# Patient Record
Sex: Female | Born: 1935 | Race: White | Hispanic: No | Smoking: Former smoker
Health system: Southern US, Community
[De-identification: ages and names within clinical notes are randomized; demographics above are authoritative.]

## PROBLEM LIST (undated history)

## (undated) DIAGNOSIS — H353190 Nonexudative age-related macular degeneration, unspecified eye, stage unspecified: Secondary | ICD-10-CM

## (undated) DIAGNOSIS — F0390 Unspecified dementia without behavioral disturbance: Secondary | ICD-10-CM

## (undated) DIAGNOSIS — F32A Depression, unspecified: Secondary | ICD-10-CM

## (undated) DIAGNOSIS — R7303 Prediabetes: Secondary | ICD-10-CM

## (undated) DIAGNOSIS — U071 COVID-19: Secondary | ICD-10-CM

## (undated) DIAGNOSIS — C4491 Basal cell carcinoma of skin, unspecified: Secondary | ICD-10-CM

## (undated) DIAGNOSIS — E785 Hyperlipidemia, unspecified: Secondary | ICD-10-CM

## (undated) DIAGNOSIS — J189 Pneumonia, unspecified organism: Secondary | ICD-10-CM

## (undated) DIAGNOSIS — M1990 Unspecified osteoarthritis, unspecified site: Secondary | ICD-10-CM

## (undated) DIAGNOSIS — R32 Unspecified urinary incontinence: Secondary | ICD-10-CM

## (undated) DIAGNOSIS — F329 Major depressive disorder, single episode, unspecified: Secondary | ICD-10-CM

## (undated) DIAGNOSIS — N1831 Chronic kidney disease, stage 3a: Secondary | ICD-10-CM

## (undated) DIAGNOSIS — K219 Gastro-esophageal reflux disease without esophagitis: Secondary | ICD-10-CM

## (undated) DIAGNOSIS — L03116 Cellulitis of left lower limb: Secondary | ICD-10-CM

## (undated) DIAGNOSIS — F419 Anxiety disorder, unspecified: Secondary | ICD-10-CM

## (undated) HISTORY — PX: ABDOMINAL HYSTERECTOMY: SHX81

## (undated) HISTORY — PX: APPENDECTOMY: SHX54

## (undated) HISTORY — PX: COLON SURGERY: SHX602

## (undated) HISTORY — PX: OTHER SURGICAL HISTORY: SHX169

## (undated) HISTORY — PX: 23 GAUGE PARS PLANA VITRECTOMY WITH 23 GAUGE MVR PORT: SHX6493

## (undated) HISTORY — PX: COLONOSCOPY: SHX174

## (undated) HISTORY — PX: TONSILLECTOMY: SUR1361

## (undated) HISTORY — PX: CHOLECYSTECTOMY: SHX55

## (undated) HISTORY — PX: JOINT REPLACEMENT: SHX530

---

## 2004-09-19 ENCOUNTER — Ambulatory Visit: Payer: Self-pay | Admitting: Anesthesiology

## 2004-10-22 ENCOUNTER — Ambulatory Visit: Payer: Self-pay | Admitting: Anesthesiology

## 2004-12-20 ENCOUNTER — Ambulatory Visit: Payer: Self-pay | Admitting: Anesthesiology

## 2005-01-21 ENCOUNTER — Ambulatory Visit: Payer: Self-pay | Admitting: Orthopaedic Surgery

## 2005-02-04 ENCOUNTER — Ambulatory Visit: Payer: Self-pay | Admitting: Orthopaedic Surgery

## 2005-02-07 ENCOUNTER — Ambulatory Visit: Payer: Self-pay | Admitting: Orthopaedic Surgery

## 2005-07-03 ENCOUNTER — Ambulatory Visit: Payer: Self-pay | Admitting: Unknown Physician Specialty

## 2008-04-09 ENCOUNTER — Ambulatory Visit: Payer: Self-pay | Admitting: Rheumatology

## 2008-11-25 HISTORY — PX: JOINT REPLACEMENT: SHX530

## 2009-07-13 ENCOUNTER — Ambulatory Visit: Payer: Self-pay | Admitting: Gastroenterology

## 2009-08-10 ENCOUNTER — Ambulatory Visit: Payer: Self-pay | Admitting: Surgery

## 2009-08-16 ENCOUNTER — Inpatient Hospital Stay: Payer: Self-pay | Admitting: Surgery

## 2010-11-27 ENCOUNTER — Emergency Department: Payer: Self-pay | Admitting: Internal Medicine

## 2011-05-15 ENCOUNTER — Ambulatory Visit: Payer: Self-pay | Admitting: Unknown Physician Specialty

## 2011-06-06 ENCOUNTER — Ambulatory Visit: Payer: Self-pay | Admitting: Unknown Physician Specialty

## 2011-06-12 ENCOUNTER — Ambulatory Visit: Payer: Self-pay | Admitting: Unknown Physician Specialty

## 2011-06-25 ENCOUNTER — Emergency Department: Payer: Self-pay

## 2011-07-09 ENCOUNTER — Ambulatory Visit: Payer: Self-pay | Admitting: Unknown Physician Specialty

## 2012-02-08 ENCOUNTER — Emergency Department: Payer: Self-pay | Admitting: Emergency Medicine

## 2012-08-31 ENCOUNTER — Ambulatory Visit: Payer: Self-pay | Admitting: General Practice

## 2012-08-31 DIAGNOSIS — M79609 Pain in unspecified limb: Secondary | ICD-10-CM

## 2012-08-31 LAB — URINALYSIS, COMPLETE
Ketone: NEGATIVE
Leukocyte Esterase: NEGATIVE
Nitrite: NEGATIVE
Ph: 5 (ref 4.5–8.0)
Protein: NEGATIVE
RBC,UR: 18 /HPF (ref 0–5)
WBC UR: 1 /HPF (ref 0–5)

## 2012-08-31 LAB — BASIC METABOLIC PANEL
Anion Gap: 8 (ref 7–16)
BUN: 16 mg/dL (ref 7–18)
Calcium, Total: 8.7 mg/dL (ref 8.5–10.1)
Chloride: 110 mmol/L — ABNORMAL HIGH (ref 98–107)
Co2: 26 mmol/L (ref 21–32)
EGFR (African American): >60
Osmolality: 287 (ref 275–301)
Potassium: 4.2 mmol/L (ref 3.5–5.1)

## 2012-08-31 LAB — CBC
MCH: 32.8 pg (ref 26.0–34.0)
MCV: 95 fL (ref 80–100)
Platelet: 272 10*3/uL (ref 150–440)
RDW: 12.8 % (ref 11.5–14.5)

## 2012-08-31 LAB — APTT: Activated PTT: 30.6 secs (ref 23.6–35.9)

## 2012-08-31 LAB — SEDIMENTATION RATE: Erythrocyte Sed Rate: 17 mm/hr (ref 0–30)

## 2012-08-31 LAB — PROTIME-INR
INR: 1
Prothrombin Time: 13.9 secs (ref 11.5–14.7)

## 2012-08-31 LAB — MRSA PCR SCREENING

## 2012-09-01 LAB — URINE CULTURE

## 2012-09-14 ENCOUNTER — Inpatient Hospital Stay: Payer: Self-pay | Admitting: General Practice

## 2012-09-15 LAB — BASIC METABOLIC PANEL WITH GFR
Anion Gap: 7
BUN: 11 mg/dL
Calcium, Total: 8 mg/dL — ABNORMAL LOW
Chloride: 106 mmol/L
Co2: 28 mmol/L
Creatinine: 0.78 mg/dL
EGFR (African American): >60
EGFR (Non-African Amer.): >60
Glucose: 100 mg/dL — ABNORMAL HIGH
Osmolality: 281
Potassium: 4 mmol/L
Sodium: 141 mmol/L

## 2012-09-15 LAB — HEMOGLOBIN: HGB: 10.4 g/dL — ABNORMAL LOW (ref 12.0–16.0)

## 2012-09-15 LAB — PLATELET COUNT: Platelet: 220 10*3/uL

## 2012-09-16 LAB — BASIC METABOLIC PANEL
Anion Gap: 8 (ref 7–16)
BUN: 11 mg/dL (ref 7–18)
Creatinine: 0.72 mg/dL (ref 0.60–1.30)
EGFR (Non-African Amer.): >60
Glucose: 96 mg/dL (ref 65–99)
Osmolality: 282 (ref 275–301)
Potassium: 3.8 mmol/L (ref 3.5–5.1)

## 2012-09-16 LAB — PLATELET COUNT: Platelet: 232 10*3/uL (ref 150–440)

## 2013-01-25 ENCOUNTER — Ambulatory Visit: Payer: Self-pay | Admitting: Ophthalmology

## 2014-05-07 DIAGNOSIS — E785 Hyperlipidemia, unspecified: Secondary | ICD-10-CM | POA: Insufficient documentation

## 2014-05-07 DIAGNOSIS — M1990 Unspecified osteoarthritis, unspecified site: Secondary | ICD-10-CM | POA: Insufficient documentation

## 2014-06-22 DIAGNOSIS — Z86010 Personal history of colonic polyps: Secondary | ICD-10-CM | POA: Insufficient documentation

## 2014-06-22 DIAGNOSIS — Z860101 Personal history of adenomatous and serrated colon polyps: Secondary | ICD-10-CM | POA: Insufficient documentation

## 2014-07-05 ENCOUNTER — Ambulatory Visit: Payer: Self-pay | Admitting: Gastroenterology

## 2014-07-07 LAB — PATHOLOGY REPORT

## 2014-09-15 DIAGNOSIS — F325 Major depressive disorder, single episode, in full remission: Secondary | ICD-10-CM | POA: Insufficient documentation

## 2015-03-14 NOTE — Op Note (Signed)
PATIENT NAME:  Melissa Anthony, Melissa Anthony MR#:  [id number] DATE OF BIRTH:  05-06-1934  DATE OF PROCEDURE:  09/14/2012  PREOPERATIVE DIAGNOSIS: Degenerative arthrosis of the right knee.   POSTOPERATIVE DIAGNOSIS: Degenerative arthrosis of the right knee.   PROCEDURE PERFORMED: Right total knee arthroplasty using computer-assisted navigation.   SURGEON: Illene LabradorJames P. Hooten, M.D.   ASSISTANT: Van ClinesJon Wolfe, PA-C (required to maintain retraction throughout the procedure)   ANESTHESIA: Femoral nerve block and spinal.   ESTIMATED BLOOD LOSS: 50 mL.   FLUIDS REPLACED: 1200 mL of crystalloid.   TOURNIQUET TIME: 92 minutes.   DRAINS: Two medium drains to reinfusion system.   SOFT TISSUE RELEASES: Anterior cruciate ligament, posterior cruciate ligament, deep medial collateral ligament, and patellofemoral ligament.   IMPLANTS UTILIZED: DePuy PFC Sigma size 4N (narrow) posterior stabilized femoral component (cemented), size 3 MBT tibial component (cemented), 35 mm three peg oval dome patella (cemented), and a 10 mm stabilized rotating platform polyethylene insert.   INDICATIONS FOR SURGERY: The patient is a 81 year old female who has been seen for complaints of progressive right knee pain. X-rays demonstrated significant degenerative changes in tricompartmental fashion. After discussion of the risks and benefits of surgical intervention, the patient expressed understanding of the risks and benefits and agreed with plans for surgical intervention.   PROCEDURE IN DETAIL: The patient was brought to the Operating Room and, after adequate femoral nerve block and spinal anesthesia was achieved, a tourniquet was placed on the patient's upper right thigh. The patient's right knee and leg were cleaned and prepped with alcohol and DuraPrep and draped in the usual sterile fashion. A "time out" was performed as per usual protocol. The right lower extremity was exsanguinated using an Esmarch, and the tourniquet was inflated to 300  mmHg. An anterior longitudinal incision was made followed by a standard mid vastus approach. The deep fibers of the medial collateral ligament were elevated in a subperiosteal fashion off the medial flare of the tibia so as to maintain a continuous soft tissue sleeve. The patella was subluxed laterally and the patellofemoral ligament was incised. Inspection of the knee demonstrated severe degenerative changes with evidence of eburnated bone to the lateral compartment. Prominent osteophytes were debrided using a rongeur. Anterior and posterior cruciate ligaments were excised. Two 4.0 mm Schanz pins were inserted into the femur and into the tibia for attachment of the array of trackers used for computer-assisted navigation. Hip center was identified using a circumduction technique. Distal landmarks were mapped using the computer. The distal femur and proximal tibia were mapped using the computer. Distal femoral cutting guide was positioned using computer-assisted navigation so as to achieve a 5 degree distal valgus cut. Cut was performed and verified using the computer. Distal femur was sized and it was felt that a size 4N (narrow) was the appropriate size. The size 4 cutting guide was positioned using computer-assisted navigation and the anterior cut was performed and verified using the computer. This was followed by completion of the posterior and chamfer cuts. Femoral cutting guide for the central box was positioned and the central box cut was performed.   Attention was then directed to the proximal tibia. Medial and lateral menisci were excised. The extramedullary tibial cutting guide was positioned using computer-assisted navigation so as to achieve 0 degree varus valgus alignment and 0 degree posterior slope. Cut was performed and verified using the computer. The proximal tibia was sized and it was felt that a size 3 tibial tray was appropriate. Tibial and femoral trials were  inserted followed by insertion of  a 10 mm polyethylene insert. The knee was felt to be tight both in flexion and in extension and there was failure to achieve full extension. The trials were removed and the proximal tibia was cut so as to resect an additional 2 mm of bone. Cut was verified using the computer. Trial components were reinserted and 10 mm polyethylene trial was placed. Excellent mediolateral soft tissue balancing was appreciated both in full extension and in flexion. Finally, the patella was cut and prepared so as to accommodate a 35 mm three peg oval dome patella. Patellar trial was placed and the knee was placed through a range of motion with excellent patellar tracking appreciated.   Femoral trial was removed. Central post hole for the tibial component was reamed followed by insertion of a keel punch. Tibial tray was then removed. The cut surfaces of bone were irrigated with copious amounts of normal saline with antibiotic solution using pulsatile lavage and then suctioned dry. Polymethyl methacrylate cement with gentamicin was prepared in the usual fashion using a vacuum mixer. Cement was applied to the cut surface of the proximal tibia as well as along the undersurface of a size 3 MBT tibial component. The tibial component was positioned and impacted into place. Excess cement was removed using freer elevators. Cement was applied to the cut surface of the femur as well as along the posterior flanges of a size 4N (narrow) posterior stabilized femoral component. Femoral component was positioned and impacted into place. Excess cement was removed using freer elevators. Finally, cement was applied to the backside of a 35 mm three peg oval dome patella and the patellar component was positioned and patellar clamp applied. Excess cement was removed using freer elevators.   After adequate curing of cement, the tourniquet was deflated after a total tourniquet time of 92 minutes. Hemostasis was achieved using electrocautery. The knee was  irrigated with copious amounts of normal saline with antibiotic solution using pulsatile lavage and then suctioned dry. The knee was inspected for any residual cement debris. 30 mL of 0.25% Marcaine with epinephrine was injected along the posterior capsule. A 10 mm stabilized rotating platform polyethylene insert was inserted and the knee was placed through a range of motion with excellent patellar tracking appreciated and excellent mediolateral soft tissue balancing noted. Two medium drains were placed in the wound bed and brought out through a separate stab incision to be attached to a reinfusion system. The medial parapatellar portion of the incision was reapproximated using interrupted sutures of #1 Vicryl. The subcutaneous tissue was approximated in layers using first #0 Vicryl followed by #2-0 Vicryl. Skin was closed with skin staples. A sterile dressing was applied.   The patient tolerated the procedure well. She was transported to the recovery room in stable condition. ____________________________ Illene Labrador. Angie Fava., MD jph:slb D: 09/14/2012 22:24:43 ET T: 09/15/2012 09:46:30 ET JOB#: [id number]  cc: Illene Labrador. Angie Fava., MD, <Dictator> JAMES P Angie Fava MD ELECTRONICALLY SIGNED 09/15/2012 21:18

## 2015-03-14 NOTE — Discharge Summary (Signed)
PATIENT NAME:  Melissa Anthony, Melissa Anthony MR#:  [id number] DATE OF BIRTH:  1934-10-25  DATE OF ADMISSION:  09/14/2012 DATE OF DISCHARGE:  09/16/2012  ADMITTING DIAGNOSIS: Degenerative arthrosis of the right knee.   DISCHARGE DIAGNOSIS: Degenerative arthrosis of the right knee.   HISTORY: The patient is a pleasant 81 year old who has been followed at [facility] for progression of right knee pain. The patient had complained of a long history of progressive right knee pain. She has previously underwent a right knee arthroscopy for a partial medial and lateral meniscectomy as well as debridement and microfracture of a chondral lesion to both the medial and lateral compartments. This was performed by Dr. Erin Sons in July 2012. She saw modest improvement following Synvisc I injections as well. She states she has continued to have increased swelling and pain to the right knee. She has not seen any significant improvement in her condition despite the use of over-the-counter Tylenol, Aleve and has been taking some Tramadol. She did not see any significant improvement following cortisone injection earlier this year. She reports pain both to the medial and lateral aspect of the knee. The pain was noted be aggravated with weight-bearing activities. She is having increasing difficulty with arising from a sitting position. Her pain had progressed to the point that it was significantly interfering with her activities of daily living. X-rays taken in [facility] showed narrowing of the lateral cartilage space with associated valgus alignment. She was noted to have subchondral sclerosis as well as osteophyte formation. After discussion of the risks and benefits of surgical intervention, the patient expressed her understanding of the risks and benefits and agreed with plans for surgical intervention.   PROCEDURE: Right total knee arthroplasty using computer-assisted navigation.   ANESTHESIA: Femoral nerve  block with spinal.   IMPLANTS UTILIZED: DePuy PFC Sigma size 4 narrow posterior stabilized femoral component (cemented), size 3 MBT tibial component (cemented), 35 mm three pegged oval dome patella (cemented), and a 10 mm stabilized rotating platform polyethylene insert.   HOSPITAL COURSE: The patient tolerated the procedure very well. She had no complications. She was then taken to the PAC-U where she was stabilized and then transferred to the orthopedic floor. The patient began receiving anticoagulation therapy of Lovenox 30 mg subcutaneous every 12 hours per anesthesia and pharmacy protocol. She was fitted with TED stockings bilaterally. These were allowed to be removed one hour per eight hour shift. She was also fitted with the AV-I compression foot pumps bilaterally set at 80 mmHg. Her calves have been nontender. She has had no evidence of any deep venous thromboses. Negative Homans sign. Heels were elevated off the bed using rolled towels.   The patient has denied any chest pain or shortness of breath. Her vital signs have been stable. She has been afebrile. Hemodynamically she was stable and no transfusions were given other than the Autovac transfusion given the first six hours postoperatively.   Physical therapy was initiated on day one for gait training and transfers. Upon being discharged she was ambulating greater than 300 feet. She was able go up and down four sets of steps. She was independent with bed to chair transfers. Occupational therapy was also initiated on day one for activities of daily living and assistive devices.   DISPOSITION: The patient is being discharged to home in improved stable condition.   DISCHARGE INSTRUCTIONS: She may weight bear as tolerated. Continue using a walker until cleared by physical therapy to go to a quad cane.  She will receive home health physical therapy. This is for the duration of two weeks. She is to continue wearing her TED stockings bilaterally.  These may be removed at night and worn during the day. She is also to continue the Polar Care maintaining a temperature of 40 to 50 degrees Fahrenheit. She is placed on a regular diet. She is to resume her regular medication that she was on prior to admission. She was given a prescription for Lovenox 40 mg, dispense 14 days, one subcutaneous daily for 14 days then discontinue and begin taking one 81 mg enteric-coated aspirin and a second prescription for Roxicodone 5 to 10 mg every 4 to 6 hours p.r.n. for pain and Ultram 50 to 100 mg every 4 to 6 hours p.r.n. for pain.   PAST MEDICAL HISTORY:  1. Chickenpox.  2. Colon polyps. 3. Arthritis. 4. Acid reflux.       5. Hyperlipidemia.  6. Cervical and lumbar osteoarthritis. ____________________________ Van ClinesJon Deniyah Dillavou, PA jrw:slb D: 09/16/2012 14:15:09 ET T: 09/16/2012 14:41:41 ET JOB#: [id number]  cc: Van ClinesJon Paije Goodhart, PA, <Dictator> Conchita Truxillo PA ELECTRONICALLY SIGNED 09/16/2012 20:52

## 2015-03-17 DIAGNOSIS — N959 Unspecified menopausal and perimenopausal disorder: Secondary | ICD-10-CM | POA: Insufficient documentation

## 2015-03-17 NOTE — Op Note (Signed)
PATIENT NAME:  Melissa Anthony, Melissa Anthony MR#:  [id number] DATE OF BIRTH:  12/03/1933  DATE OF PROCEDURE:  01/25/2013  PREOPERATIVE DIAGNOSIS: Cataract, right eye.   POSTOPERATIVE DIAGNOSIS: Cataract, right eye.   PROCEDURE PERFORMED: Extracapsular cataract extraction using phacoemulsification with placement of an Alcon [id number] 22.5 diopter posterior chamber lens with 2.25 diopter cylinder, serial #[id number].   SURGEON: Maylon PeppersSteven A. Dingeldein, M.D.   ANESTHESIA: 4% lidocaine and 0.75% Marcaine a 50-50 mixture with 10 units/mL of Hylenex added, given as a peribulbar.   ANESTHESIOLOGIST: Randall AnGjibertus Van Staveren, MD  COMPLICATIONS: None.   ESTIMATED BLOOD LOSS: Less than 1 mL.  DESCRIPTION OF PROCEDURE: The patient was brought to the operating room and both eyes were anesthetized with topical proparacaine. With the patient sitting upright, an ASICO toric marker was used to mark the 3 and 9 o'clock positions, with the patient fixating at a distant target. The patient was placed supine, given IV sedation and peribulbar block. She was then prepped and draped in the usual fashion. The vertical rectus muscles were imbricated using 5-0 silk sutures, bridle sutures. A limbal peritomy was carried out for 1 o'clock hour and centered on 90 degrees, which had been marked with a marking pen, and an incision was made with a 64 blade. This was dissected anteriorly into clear cornea with Alcon crescent knife. The anterior chamber was entered superonasally through clear cornea with a paracentesis knife and through the lamellar dissection with a 2.6 mm keratome. DisCoVisc was used to replace the aqueous and continuous tear circular capsulorrhexis was carried out without difficulty. Hydrodissection was used to loosen the nucleus and phacoemulsification was carried out in a divide and conquer technique. Total ultrasound time was 1 minute and 24.5 seconds with an average power of 25.4% and CDE of 39.69. Irrigation and aspiration  was used to remove the residual cortex. The capsular bag was inflated with DisCoVisc and the intraocular lens was inserted using a ParamedicMonarch shooter. The lens was rotated to bring the marks on the base of the haptics or the periphery of the optic nudge just slightly beyond the 180 degree mark. The intended position was 1 degree and I felt that marking using a toric marker would not add to the accuracy of the procedure. Irrigation and aspiration was used to remove the residual DisCoVisc. The wound was inflated with balanced salt. The position of the lens was checked 1 more time. Miostat was injected through the paracentesis track. A tenth of a milliliter of cefuroxime was injected into the anterior chamber above the lens. The wound was checked for leaks, none were found. The conjunctiva was closed with cautery. Bridle sutures were removed. Two drops of Vigamox were placed on the eye. A shield was placed on the eye. The patient was discharged to the recovery room in good condition.  ____________________________ Maylon PeppersSteven A. Dingeldein, MD sad:sb D: 01/25/2013 13:44:32 ET T: 01/25/2013 14:30:25 ET JOB#: [id number]  cc: Viviann SpareSteven A. Dingeldein, MD, <Dictator> Erline LevineSTEVEN A DINGELDEIN MD ELECTRONICALLY SIGNED 02/01/2013 8:23

## 2018-03-24 ENCOUNTER — Inpatient Hospital Stay
Admission: EM | Admit: 2018-03-24 | Discharge: 2018-03-25 | DRG: 194 | Disposition: A | Discharge status: Home / Self Care | Payer: MEDICARE | Attending: Internal Medicine | Admitting: Internal Medicine

## 2018-03-24 ENCOUNTER — Encounter: Payer: Self-pay | Admitting: Emergency Medicine

## 2018-03-24 ENCOUNTER — Emergency Department: Payer: MEDICARE

## 2018-03-24 ENCOUNTER — Other Ambulatory Visit: Payer: Self-pay

## 2018-03-24 DIAGNOSIS — Z791 Long term (current) use of non-steroidal anti-inflammatories (NSAID): Secondary | ICD-10-CM | POA: Diagnosis not present

## 2018-03-24 DIAGNOSIS — R4182 Altered mental status, unspecified: Secondary | ICD-10-CM

## 2018-03-24 DIAGNOSIS — Z8249 Family history of ischemic heart disease and other diseases of the circulatory system: Secondary | ICD-10-CM | POA: Diagnosis not present

## 2018-03-24 DIAGNOSIS — M1990 Unspecified osteoarthritis, unspecified site: Secondary | ICD-10-CM | POA: Diagnosis present

## 2018-03-24 DIAGNOSIS — E860 Dehydration: Secondary | ICD-10-CM | POA: Diagnosis present

## 2018-03-24 DIAGNOSIS — F430 Acute stress reaction: Secondary | ICD-10-CM | POA: Diagnosis present

## 2018-03-24 DIAGNOSIS — E876 Hypokalemia: Secondary | ICD-10-CM | POA: Diagnosis not present

## 2018-03-24 DIAGNOSIS — N179 Acute kidney failure, unspecified: Secondary | ICD-10-CM | POA: Diagnosis present

## 2018-03-24 DIAGNOSIS — Z90710 Acquired absence of both cervix and uterus: Secondary | ICD-10-CM

## 2018-03-24 DIAGNOSIS — E785 Hyperlipidemia, unspecified: Secondary | ICD-10-CM | POA: Diagnosis not present

## 2018-03-24 DIAGNOSIS — Z79890 Hormone replacement therapy (postmenopausal): Secondary | ICD-10-CM

## 2018-03-24 DIAGNOSIS — K219 Gastro-esophageal reflux disease without esophagitis: Secondary | ICD-10-CM | POA: Diagnosis present

## 2018-03-24 DIAGNOSIS — J189 Pneumonia, unspecified organism: Secondary | ICD-10-CM | POA: Diagnosis present

## 2018-03-24 DIAGNOSIS — F329 Major depressive disorder, single episode, unspecified: Secondary | ICD-10-CM | POA: Diagnosis present

## 2018-03-24 DIAGNOSIS — Z79899 Other long term (current) drug therapy: Secondary | ICD-10-CM

## 2018-03-24 DIAGNOSIS — Z9049 Acquired absence of other specified parts of digestive tract: Secondary | ICD-10-CM | POA: Diagnosis not present

## 2018-03-24 DIAGNOSIS — N289 Disorder of kidney and ureter, unspecified: Secondary | ICD-10-CM

## 2018-03-24 LAB — BASIC METABOLIC PANEL
ANION GAP: 11 (ref 5–15)
BUN: 26 mg/dL — ABNORMAL HIGH (ref 6–20)
CALCIUM: 9.6 mg/dL (ref 8.9–10.3)
CO2: 23 mmol/L (ref 22–32)
Chloride: 104 mmol/L (ref 101–111)
Creatinine, Ser: 1.39 mg/dL — ABNORMAL HIGH (ref 0.44–1.00)
GFR calc non Af Amer: 35 mL/min — ABNORMAL LOW (ref >60)
GFR, EST AFRICAN AMERICAN: 40 mL/min — AB (ref >60)
GLUCOSE: 162 mg/dL — AB (ref 65–99)
POTASSIUM: 3.4 mmol/L — AB (ref 3.5–5.1)
Sodium: 138 mmol/L (ref 135–145)

## 2018-03-24 LAB — CBC WITH DIFFERENTIAL/PLATELET
BASOS ABS: 0 10*3/uL (ref 0–0.1)
BASOS PCT: 0 %
Eosinophils Absolute: 0 10*3/uL (ref 0–0.7)
Eosinophils Relative: 0 %
HEMATOCRIT: 38.1 % (ref 35.0–47.0)
HEMOGLOBIN: 12.7 g/dL (ref 12.0–16.0)
LYMPHS PCT: 7 %
Lymphs Abs: 1.1 10*3/uL (ref 1.0–3.6)
MCH: 31.8 pg (ref 26.0–34.0)
MCHC: 33.3 g/dL (ref 32.0–36.0)
MCV: 95.4 fL (ref 80.0–100.0)
MONO ABS: 0.9 10*3/uL (ref 0.2–0.9)
Monocytes Relative: 5 %
NEUTROS ABS: 14.8 10*3/uL — AB (ref 1.4–6.5)
NEUTROS PCT: 88 %
Platelets: 318 10*3/uL (ref 150–440)
RBC: 4 MIL/uL (ref 3.80–5.20)
RDW: 13 % (ref 11.5–14.5)
WBC: 16.8 10*3/uL — AB (ref 3.6–11.0)

## 2018-03-24 MED ORDER — TEMAZEPAM 15 MG PO CAPS: 15.0000 mg | ORAL_CAPSULE | Freq: Every day | ORAL | Status: DC

## 2018-03-24 MED ORDER — ESTRADIOL 1 MG PO TABS
1.0000 mg | ORAL_TABLET | Freq: Every day | ORAL | Status: DC
  Administered 2018-03-25: 1 mg via ORAL
  Filled 2018-03-24: qty 1

## 2018-03-24 MED ORDER — HEPARIN SODIUM (PORCINE) 5000 UNIT/ML IJ SOLN
5000.0000 [IU] | Freq: Three times a day (TID) | INTRAMUSCULAR | Status: DC
  Administered 2018-03-24 – 2018-03-25 (×2): 5000 [IU] via SUBCUTANEOUS
  Filled 2018-03-24 (×2): qty 1

## 2018-03-24 MED ORDER — SODIUM CHLORIDE 0.9 % IV SOLN
1.0000 g | INTRAVENOUS | Status: DC
  Filled 2018-03-24: qty 10

## 2018-03-24 MED ORDER — SODIUM CHLORIDE 0.9 % IV BOLUS
500.0000 mL | Freq: Once | INTRAVENOUS | Status: AC
  Administered 2018-03-24: 500 mL via INTRAVENOUS

## 2018-03-24 MED ORDER — VITAMIN D 1000 UNITS PO TABS
1000.0000 [IU] | ORAL_TABLET | Freq: Every day | ORAL | Status: DC
  Administered 2018-03-25: 09:00:00 1000 [IU] via ORAL
  Filled 2018-03-24: qty 1

## 2018-03-24 MED ORDER — SODIUM CHLORIDE 0.9 % IV SOLN
500.0000 mg | Freq: Once | INTRAVENOUS | Status: AC
  Administered 2018-03-24: 500 mg via INTRAVENOUS
  Filled 2018-03-24: qty 500

## 2018-03-24 MED ORDER — AZITHROMYCIN 500 MG PO TABS: 500.0000 mg | ORAL_TABLET | ORAL | Status: DC

## 2018-03-24 MED ORDER — BUDESONIDE 0.5 MG/2ML IN SUSP
0.5000 mg | Freq: Two times a day (BID) | RESPIRATORY_TRACT | Status: DC
  Administered 2018-03-25: 08:00:00 0.5 mg via RESPIRATORY_TRACT
  Filled 2018-03-24: qty 2

## 2018-03-24 MED ORDER — VITAMIN C 500 MG PO TABS
500.0000 mg | ORAL_TABLET | Freq: Every day | ORAL | Status: DC
  Administered 2018-03-25: 500 mg via ORAL
  Filled 2018-03-24: qty 1

## 2018-03-24 MED ORDER — FAMOTIDINE 20 MG PO TABS
20.0000 mg | ORAL_TABLET | Freq: Two times a day (BID) | ORAL | Status: DC
Start: 2018-03-24 — End: 2018-03-25
  Administered 2018-03-24 – 2018-03-25 (×2): 20 mg via ORAL
  Filled 2018-03-24 (×2): qty 1

## 2018-03-24 MED ORDER — POTASSIUM CHLORIDE IN NACL 20-0.45 MEQ/L-% IV SOLN
INTRAVENOUS | Status: AC
  Administered 2018-03-24: 23:00:00 via INTRAVENOUS
  Filled 2018-03-24 (×2): qty 1000

## 2018-03-24 MED ORDER — SERTRALINE HCL 50 MG PO TABS
50.0000 mg | ORAL_TABLET | Freq: Every day | ORAL | Status: DC
  Administered 2018-03-25: 50 mg via ORAL
  Filled 2018-03-24: qty 1

## 2018-03-24 MED ORDER — SODIUM CHLORIDE 0.9 % IV SOLN
1.0000 g | Freq: Once | INTRAVENOUS | Status: AC
  Administered 2018-03-24: 1 g via INTRAVENOUS
  Filled 2018-03-24: qty 10

## 2018-03-24 MED ORDER — CLONAZEPAM 0.5 MG PO TBDP: 0.5000 mg | ORAL_TABLET | Freq: Two times a day (BID) | ORAL | Status: DC | PRN

## 2018-03-24 MED ORDER — IPRATROPIUM-ALBUTEROL 0.5-2.5 (3) MG/3ML IN SOLN
3.0000 mL | Freq: Four times a day (QID) | RESPIRATORY_TRACT | Status: DC
  Administered 2018-03-25 (×2): 3 mL via RESPIRATORY_TRACT
  Filled 2018-03-24 (×3): qty 3

## 2018-03-24 MED ORDER — ADULT MULTIVITAMIN W/MINERALS CH
1.0000 | ORAL_TABLET | Freq: Every day | ORAL | Status: DC
  Administered 2018-03-25: 1 via ORAL
  Filled 2018-03-24: qty 1

## 2018-03-24 NOTE — ED Notes (Signed)
Patient transported to X-ray 

## 2018-03-24 NOTE — ED Provider Notes (Signed)
[facility] Emergency Department Provider Note  ____________________________________________  Time seen: Approximately 6:29 PM  I have reviewed the triage vital signs and the nursing notes.   HISTORY  Chief Complaint Shortness of Breath    HPI Melissa Anthony is a 84 y.o. female who complains of shortness of breath for the past 5 or 6 days. It has been constant, worse with walking, no alleviating factors. Denies orthopnea. Also associated with productive cough. Denies chest pain. No fevers or chills. Shortness of breath is moderate in severity.  She saw her primary care doctor 4 days ago, was started on amoxicillin. Had labs at that time which were unremarkable according to the electronic medical record. Had a chest x-ray performed yesterday, received a call back from her doctor today saying that she had pneumonia and should also start azithromycin. Now comes to the ED for evaluation given her persistent symptoms.      History reviewed. No pertinent past medical history. hyperlipidemia Osteoarthritis Depression  There are no active problems to display for this patient.    Past Surgical History:  Procedure Laterality Date  . ABDOMINAL HYSTERECTOMY    . APPENDECTOMY    . CHOLECYSTECTOMY    . TONSILLECTOMY       Prior to Admission medications   Medication Sig Start Date End Date Taking? Authorizing Provider  acetaminophen (TYLENOL) 650 MG CR tablet Take 650 mg by mouth every 8 (eight) hours as needed for pain.   Yes [provider]  amoxicillin (AMOXIL) 875 MG tablet Take 1 tablet by mouth 2 (two) times daily. 03/23/18 03/30/18 Yes [provider]  ascorbic acid (VITAMIN C) 500 MG tablet Take 500 mg by mouth daily.   Yes [provider]  cholecalciferol (VITAMIN D) 1000 units tablet Take 1,000 Units by mouth daily.   Yes [provider]  estradiol (ESTRACE) 1 MG tablet Take 1 mg by mouth daily.   Yes [provider]  guaiFENesin-codeine (ROBITUSSIN AC) 100-10 MG/5ML syrup Take 5 mLs by mouth 3 (three) times daily as needed for cough.   Yes [provider]  Multiple Vitamin (MULTIVITAMIN WITH MINERALS) TABS tablet Take 1 tablet by mouth daily.   Yes [provider]  naproxen sodium (ALEVE) 220 MG tablet Take 220 mg by mouth 2 (two) times daily as needed (pain).   Yes [provider]  predniSONE (DELTASONE) 20 MG tablet Take 3 tablets by mouth daily for 3 days, two daily for two days then take 1 tablet by mouth daily for 3 days 03/23/18  Yes [provider]  ranitidine (ZANTAC) 150 MG tablet Take 1 tablet by mouth daily. 01/06/18  Yes [provider]  sertraline (ZOLOFT) 50 MG tablet Take 1 tablet by mouth daily. 03/01/18  Yes [provider]  azithromycin (ZITHROMAX) 500 MG tablet Take 1 tablet by mouth daily. 03/24/18   [provider]  Medications Reconcile with Patient's Chart  Medication Sig Dispensed Refills Start Date End Date Status  cholecalciferol (CHOLECALCIFEROL) 1,000 unit tablet  Take 1,000 Units by mouth once daily.  0   Active  acetaminophen (TYLENOL) 650 MG ER tablet  Take 650 mg by mouth every 8 (eight) hours as needed for Pain.  0   Active  multivitamin capsule  Take 1 capsule by mouth once daily.  0   Active  ascorbic acid (VITAMIN C) 500 MG tablet  Take 500 mg by mouth once daily.  0   Active  naproxen sodium (ALEVE,  ANAPROX) 220 MG tablet  Take 220 mg by mouth 2 (two) times daily as needed for Pain.  0   Active  estradiol (ESTRACE) 1 MG tablet  Take 1 tablet (1 mg total) by mouth once daily. 30 tablet  11 03/26/2017 03/26/2018 Active  traMADol (ULTRAM) 50 mg tablet  Take 50 mg by mouth every 6 (six) hours as needed for Pain.  0   Active  ranitidine (ZANTAC) 150 MG tablet  Take 1 tablet (150 mg total) by mouth once daily. 90 tablet  0 10/03/2017  Active  sertraline (ZOLOFT) 50 MG tablet   Take 1 tablet (50 mg total) by mouth once daily 30 tablet  6 10/28/2017  Active  amoxicillin (AMOXIL) 875 MG tablet  Take 1 tablet (875 mg total) by mouth 2 (two) times daily 14 tablet  0 03/23/2018 03/30/2018 Active  predniSONE (DELTASONE) 20 MG tablet  Prednisone 20 mg; 3 a day for 3 days then 2 a day for 3 days then 1 a day for 3 days 18 tablet  0 03/23/2018  Active  codeine-guaifenesin 10-100 mg/5 mL oral liquid  Take 5 mLs by mouth every 4 (four) hours as needed for Cough 60 mL  0 03/23/2018  Active  azithromycin (ZITHROMAX) 500 MG tablet  Take 1 tablet (500 mg total) by mouth once daily 5 tablet  0 03/24/2018 03/29/2018 Active      Allergies Patient has no known allergies.   No family history on file.  Social History Social History   Tobacco Use  . Smoking status: Never Smoker  Substance Use Topics  . Alcohol use: Never    Frequency: Never  . Drug use: Never    Review of Systems  Constitutional:   No fever or chills.  ENT:   No sore throat. No rhinorrhea. Cardiovascular:   No chest pain or syncope. Respiratory:   positive shortness of breath and cough. Gastrointestinal:   Negative for abdominal pain, vomiting and diarrhea.  Musculoskeletal:   Negative for focal pain or swelling All other systems reviewed and are negative except as documented above in ROS and HPI.  ____________________________________________   PHYSICAL EXAM:  VITAL SIGNS: ED Triage Vitals  Enc Vitals Group     BP 03/24/18 1745 (!) 151/82     Pulse Rate 03/24/18 1745 86     Resp 03/24/18 1745 (!) 24     Temp 03/24/18 1745 97.7 F (36.5 C)     Temp Source 03/24/18 1745 Oral     SpO2 03/24/18 1745 96 %     Weight 03/24/18 1746 165 lb (74.8 kg)     Height 03/24/18 1746  (1.626 m)     Head Circumference --      Peak Flow --      Pain Score 03/24/18 1745 5     Pain Loc --      Pain Edu? --      Excl. in GC? --     Vital signs reviewed, nursing assessments  reviewed.   Constitutional:   Alert and oriented. Well appearing and in no distress. Eyes:   Conjunctivae are normal. EOMI. PERRL. ENT      Head:   Normocephalic and atraumatic.      Nose:   No congestion/rhinnorhea.       Mouth/Throat:   dry mucous membranes, no pharyngeal erythema. No peritonsillar mass.       Neck:   No meningismus. Full ROM. Hematological/Lymphatic/Immunilogical:   No cervical lymphadenopathy. Cardiovascular:  RRR. Symmetric bilateral radial and DP pulses.  No murmurs.  Respiratory:   Normal respiratory effort without tachypnea/retractions. Breath sounds are clear and equal bilaterally. No wheezes/rales/rhonchi. Gastrointestinal:   Soft and nontender. Non distended. There is no CVA tenderness.  No rebound, rigidity, or guarding. Genitourinary:   deferred Musculoskeletal:   Normal range of motion in all extremities. No joint effusions.  No lower extremity tenderness.  No edema. Neurologic:   Normal speech and language.  Motor grossly intact. No acute focal neurologic deficits are appreciated.  Skin:    Skin is warm, dry and intact. No rash noted.  No petechiae, purpura, or bullae.  ____________________________________________    LABS (pertinent positives/negatives) (all labs ordered are listed, but only abnormal results are displayed) Labs Reviewed  BASIC METABOLIC PANEL - Abnormal; Notable for the following components:      Result Value   Potassium 3.4 (*)    Glucose, Bld 162 (*)    BUN 26 (*)    Creatinine, Ser 1.39 (*)    GFR calc non Af Amer 35 (*)    GFR calc Af Amer 40 (*)    All other components within normal limits  CBC WITH DIFFERENTIAL/PLATELET - Abnormal; Notable for the following components:   WBC 16.8 (*)    Neutro Abs 14.8 (*)    All other components within normal limits   ____________________________________________   EKG  interpreted by me Sinus rhythm rate of 73, normal axis intervals QRS ST segments and T  waves  ____________________________________________    RADIOLOGY  Dg Chest 2 View  Result Date: 03/24/2018 CLINICAL DATA:  Shortness of breath. EXAM: CHEST - 2 VIEW COMPARISON:  None available currently. FINDINGS: The heart size and mediastinal contours are within normal limits. Both lungs are clear. No pneumothorax or pleural effusion is noted. The visualized skeletal structures are unremarkable. IMPRESSION: No active cardiopulmonary disease. Electronically Signed   By: Lupita Raider, M.D.   On: 03/24/2018 18:37    ____________________________________________   PROCEDURES Procedures  ____________________________________________  DIFFERENTIAL DIAGNOSIS   pneumonia, pneumothorax, dehydration, acidosis. Low suspicion of ACS PE dissection or pericarditis.  CLINICAL IMPRESSION / ASSESSMENT AND PLAN / ED COURSE  Pertinent labs & imaging results that were available during my care of the patient were reviewed by me and considered in my medical decision making (see chart for details).    patient not in distress,does present with tachypnea and shortness of breath. Suspected pneumonia based on relate information from primary care. We'll check labs and chest x-ray today.  ----------------------------------------- 6:33 PM on 03/24/2018 -----------------------------------------  Chest x-ray image reviewed by me, does show a right basilar infiltrate consistent with pneumonia. I'll start her on ceftriaxone and and azithromycin plan for hospitalization due to her apparent worsening despite taking amoxicillin, altered cognition suggesting delirium, and age.  Clinical Course as of Mar 24 2004  Tue Mar 24, 2018  1917 Acute renal insufficiency on labs. Cxr nondiagnostic per rads report, though on my viewing there is infiltrate in right base c/w pneumonia. Will obtain non contrast CT chest to further evaluate. Plan to hospitalize for further management of CAP given leukocytosis, age, concern for  delirium.   Creatinine(!): 1.39 [PS]    Clinical Course User Index [PS] Sharman Cheek, MD     ____________________________________________   FINAL CLINICAL IMPRESSION(S) / ED DIAGNOSES    Final diagnoses:  Atypical pneumonia  Altered mental status, unspecified altered mental status type  Acute renal insufficiency     ED Discharge  Orders    None      Portions of this note were generated with dragon dictation software. Dictation errors may occur despite best attempts at proofreading.    Sharman Cheek, MD 03/24/18 2005

## 2018-03-24 NOTE — ED Notes (Signed)
Pt returned from X-ray.  

## 2018-03-24 NOTE — H&P (Signed)
[facility] [place] [facility]   PATIENT NAME: Melissa Anthony    MR#:  [id number]  DATE OF BIRTH:  Nov 25, 1934  DATE OF ADMISSION:  03/24/2018  PRIMARY CARE PHYSICIAN: Lauro Regulus, MD   REQUESTING/REFERRING PHYSICIAN:   CHIEF COMPLAINT:   Chief Complaint  Patient presents with  . Shortness of Breath    HISTORY OF PRESENT ILLNESS: Melissa Anthony  is a 84 y.o. female with a known history history of recently diagnosed pneumonia-diagnosed on Friday by primary care provider, started taking medication on yesterday-amoxicillin with steroids, presented to the emergency room with shortness of breath localized 5 to 6 days, dyspnea on exertion, productive cough, the emergency room patient was found to have a white count of 16,000, potassium 3.4, creatinine 1.3, chest x-ray negative for any acute process, CT chest noted for right pneumonia, patient evaluated in the emergency room, in no apparent distress, no evidence of hypoxia, saturating 97 to 99% on room air, lungs are clear, and separate conversation with the patient's daughter-the daughter is concerned that the patient is having a mental breakdown, aggressive/combative environment between the patient and the patient's husband, the patient's husband is critically ill, per daughter patient has acting strange, thoughts of self-harm, stress, patient is now being admitted for acute community-acquired pneumonia, acute kidney injury, and acute stress disorder.  PAST MEDICAL HISTORY:   Hyperlipidemia GERD Osteoarthritis depression Depression  PAST SURGICAL HISTORY:  Past Surgical History:  Procedure Laterality Date  . ABDOMINAL HYSTERECTOMY    . APPENDECTOMY    . CHOLECYSTECTOMY    . TONSILLECTOMY      SOCIAL HISTORY:  Social History   Tobacco Use  . Smoking status: Never Smoker  Substance Use Topics  . Alcohol use: Never    Frequency: Never    FAMILY HISTORY:  Hypertension  DRUG ALLERGIES: No Known  Allergies  REVIEW OF SYSTEMS:   CONSTITUTIONAL: No fever, fatigue or weakness. + Stress EYES: No blurred or double vision.  EARS, NOSE, AND THROAT: No tinnitus or ear pain.  RESPIRATORY: + cough, shortness of breath, wheezing, no hemoptysis.  CARDIOVASCULAR: No chest pain, orthopnea, edema.  GASTROINTESTINAL: No nausea, vomiting, diarrhea or abdominal pain.  GENITOURINARY: No dysuria, hematuria.  ENDOCRINE: No polyuria, nocturia,  HEMATOLOGY: No anemia, easy bruising or bleeding SKIN: No rash or lesion. MUSCULOSKELETAL: No joint pain or arthritis.   NEUROLOGIC: No tingling, numbness, weakness.  PSYCHIATRY: No anxiety or depression.   MEDICATIONS AT HOME:  Prior to Admission medications   Medication Sig Start Date End Date Taking? Authorizing Provider  acetaminophen (TYLENOL) 650 MG CR tablet Take 650 mg by mouth every 8 (eight) hours as needed for pain.   Yes [provider]  amoxicillin (AMOXIL) 875 MG tablet Take 1 tablet by mouth 2 (two) times daily. 03/23/18 03/30/18 Yes [provider]  ascorbic acid (VITAMIN C) 500 MG tablet Take 500 mg by mouth daily.   Yes [provider]  cholecalciferol (VITAMIN D) 1000 units tablet Take 1,000 Units by mouth daily.   Yes [provider]  estradiol (ESTRACE) 1 MG tablet Take 1 mg by mouth daily.   Yes [provider]  guaiFENesin-codeine (ROBITUSSIN AC) 100-10 MG/5ML syrup Take 5 mLs by mouth 3 (three) times daily as needed for cough.   Yes [provider]  Multiple Vitamin (MULTIVITAMIN WITH MINERALS) TABS tablet Take 1 tablet by mouth daily.   Yes [provider]  naproxen sodium (ALEVE) 220 MG tablet Take 220 mg by  mouth 2 (two) times daily as needed (pain).   Yes [provider]  predniSONE (DELTASONE) 20 MG tablet Take 3 tablets by mouth daily for 3 days, two daily for two days then take 1 tablet by mouth daily for 3 days 03/23/18  Yes [provider]  ranitidine  (ZANTAC) 150 MG tablet Take 1 tablet by mouth daily. 01/06/18  Yes [provider]  sertraline (ZOLOFT) 50 MG tablet Take 1 tablet by mouth daily. 03/01/18  Yes [provider]  azithromycin (ZITHROMAX) 500 MG tablet Take 1 tablet by mouth daily. 03/24/18   [provider]      PHYSICAL EXAMINATION:   VITAL SIGNS: Blood pressure 118/65, pulse 76, temperature 97.7 F (36.5 C), temperature source Oral, resp. rate 18, height  (1.626 m), weight 74.8 kg (165 lb), SpO2 96 %.  GENERAL:  84 y.o.-year-old patient lying in the bed with no acute distress.  EYES: Pupils equal, round, reactive to light and accommodation. No scleral icterus. Extraocular muscles intact.  HEENT: Head atraumatic, normocephalic. Oropharynx and nasopharynx clear.  NECK:  Supple, no jugular venous distention. No thyroid enlargement, no tenderness.  LUNGS: Normal breath sounds bilaterally, no wheezing, rales,rhonchi or crepitation. No use of accessory muscles of respiration.  CARDIOVASCULAR: S1, S2 normal. No murmurs, rubs, or gallops.  ABDOMEN: Soft, nontender, nondistended. Bowel sounds present. No organomegaly or mass.  EXTREMITIES: No pedal edema, cyanosis, or clubbing.  NEUROLOGIC: Cranial nerves II through XII are intact. MAES. Gait not checked.  PSYCHIATRIC: The patient is alert and oriented x 3.  Anxious appearing, no suicidal ideation SKIN: No obvious rash, lesion, or ulcer.   LABORATORY PANEL:   CBC Recent Labs  Lab 03/24/18 1756  WBC 16.8*  HGB 12.7  HCT 38.1  PLT 318  MCV 95.4  MCH 31.8  MCHC 33.3  RDW 13.0  LYMPHSABS 1.1  MONOABS 0.9  EOSABS 0.0  BASOSABS 0.0   ------------------------------------------------------------------------------------------------------------------  Chemistries  Recent Labs  Lab 03/24/18 1756  NA 138  K 3.4*  CL 104  CO2 23  GLUCOSE 162*  BUN 26*  CREATININE 1.39*  CALCIUM 9.6    ------------------------------------------------------------------------------------------------------------------ estimated creatinine clearance is 31.4 mL/min (A) (by C-G formula based on SCr of 1.39 mg/dL (H)). ------------------------------------------------------------------------------------------------------------------ No results for input(s): TSH, T4TOTAL, T3FREE, THYROIDAB in the last 72 hours.  Invalid input(s): FREET3   Coagulation profile No results for input(s): INR, PROTIME in the last 168 hours. ------------------------------------------------------------------------------------------------------------------- No results for input(s): DDIMER in the last 72 hours. -------------------------------------------------------------------------------------------------------------------  Cardiac Enzymes No results for input(s): CKMB, TROPONINI, MYOGLOBIN in the last 168 hours.  Invalid input(s): CK ------------------------------------------------------------------------------------------------------------------ Invalid input(s): POCBNP  ---------------------------------------------------------------------------------------------------------------  Urinalysis    Component Value Date/Time   COLORURINE Yellow 08/31/2012 0855   APPEARANCEUR Cloudy 08/31/2012 0855   LABSPEC 1.020 08/31/2012 0855   PHURINE 5.0 08/31/2012 0855   GLUCOSEU Negative 08/31/2012 0855   KETONESUR Negative 08/31/2012 0855   PROTEINUR Negative 08/31/2012 0855   NITRITE Negative 08/31/2012 0855   LEUKOCYTESUR Negative 08/31/2012 0855     RADIOLOGY: Dg Chest 2 View  Result Date: 03/24/2018 CLINICAL DATA:  Shortness of breath. EXAM: CHEST - 2 VIEW COMPARISON:  None available currently. FINDINGS: The heart size and mediastinal contours are within normal limits. Both lungs are clear. No pneumothorax or pleural effusion is noted. The visualized skeletal structures are unremarkable. IMPRESSION: No active  cardiopulmonary disease. Electronically Signed   By: Lupita Raider, M.D.   On: 03/24/2018 18:37   Ct  Chest Wo Contrast  Result Date: 03/24/2018 CLINICAL DATA:  Shortness of breath. EXAM: CT CHEST WITHOUT CONTRAST TECHNIQUE: Multidetector CT imaging of the chest was performed following the standard protocol without IV contrast. COMPARISON:  Radiographs of March 24, 2018. FINDINGS: Cardiovascular: Atherosclerosis of thoracic aorta is noted without aneurysm formation. Coronary artery calcifications are noted. Normal cardiac size. No pericardial effusion. Mediastinum/Nodes: No enlarged mediastinal or axillary lymph nodes. Thyroid gland, trachea, and esophagus demonstrate no significant findings. Lungs/Pleura: No pneumothorax or pleural effusion is noted. Mild biapical scarring. Mild right basilar opacity is noted most consistent with pneumonia. Upper Abdomen: No acute abnormality. Musculoskeletal: No chest wall mass or suspicious bone lesions identified. IMPRESSION: Mild right basilar opacity is noted concerning for pneumonia. Coronary artery calcifications are noted suggesting coronary artery disease. Aortic Atherosclerosis (ICD10-I70.0). Electronically Signed   By: Lupita Raider, M.D.   On: 03/24/2018 20:15    EKG: Orders placed or performed during the hospital encounter of 03/24/18  . ED EKG  . ED EKG    IMPRESSION AND PLAN: *Acute right-sided pneumonia, minimal - mild Admit to regular nursing floor bed, pneumonia protocol, empiric Rocephin/azithromycin, follow-up on cultures, breathing treatments scheduled, discontinue steroids  *Acute stress disorder with history of depression This is the main issue for admission in discussion with the daughter Continue home psychotropic regimen, Restoril for sleep, Klonopin as needed anxiety, consult psychiatry for expert opinion  *Acute kidney injury Most likely secondary to dehydration Avoid nephrotoxic agents, IV fluids for rehydration, BMP in the  morning  *Acute hypokalemia Check magnesium level, replete with p.o. potassium, BMP in the morning  *Chronic GERD without esophagitis PPI daily   All the records are reviewed and case discussed with ED provider. Management plans discussed with the patient, family and they are in agreement.  CODE STATUS:full    TOTAL TIME TAKING CARE OF THIS PATIENT: 45 minutes.    Evelena Asa Andyn Sales M.D on 03/24/2018   Between 7am to 6pm - Pager - [phone]  After 6pm go to [email or web address] - Social research officer, government  [facility]  Office  [phone]  CC: Primary care physician; Lauro Regulus, MD   Note: This dictation was prepared with Dragon dictation along with smaller phrase technology. Any transcriptional errors that result from this process are unintentional.

## 2018-03-24 NOTE — ED Triage Notes (Signed)
Pt to ED via [facility], with complaints of SOB. She went to her PMD on Friday, they called her today and told her that she has pneumonia. She was put on Prednisone and antibiotics. She had a coughing fit and was unable to catch her breath. She reports that her chest hurts when she coughs.

## 2018-03-25 DIAGNOSIS — Z79899 Other long term (current) drug therapy: Secondary | ICD-10-CM | POA: Diagnosis not present

## 2018-03-25 DIAGNOSIS — F430 Acute stress reaction: Secondary | ICD-10-CM | POA: Diagnosis not present

## 2018-03-25 DIAGNOSIS — E860 Dehydration: Secondary | ICD-10-CM | POA: Diagnosis not present

## 2018-03-25 DIAGNOSIS — E785 Hyperlipidemia, unspecified: Secondary | ICD-10-CM | POA: Diagnosis not present

## 2018-03-25 DIAGNOSIS — Z90710 Acquired absence of both cervix and uterus: Secondary | ICD-10-CM | POA: Diagnosis not present

## 2018-03-25 DIAGNOSIS — Z791 Long term (current) use of non-steroidal anti-inflammatories (NSAID): Secondary | ICD-10-CM | POA: Diagnosis not present

## 2018-03-25 DIAGNOSIS — J189 Pneumonia, unspecified organism: Secondary | ICD-10-CM | POA: Diagnosis present

## 2018-03-25 DIAGNOSIS — Z8249 Family history of ischemic heart disease and other diseases of the circulatory system: Secondary | ICD-10-CM | POA: Diagnosis not present

## 2018-03-25 DIAGNOSIS — K219 Gastro-esophageal reflux disease without esophagitis: Secondary | ICD-10-CM | POA: Diagnosis not present

## 2018-03-25 DIAGNOSIS — E876 Hypokalemia: Secondary | ICD-10-CM | POA: Diagnosis not present

## 2018-03-25 DIAGNOSIS — M1990 Unspecified osteoarthritis, unspecified site: Secondary | ICD-10-CM | POA: Diagnosis not present

## 2018-03-25 DIAGNOSIS — F329 Major depressive disorder, single episode, unspecified: Secondary | ICD-10-CM | POA: Diagnosis not present

## 2018-03-25 DIAGNOSIS — R4182 Altered mental status, unspecified: Secondary | ICD-10-CM | POA: Diagnosis not present

## 2018-03-25 DIAGNOSIS — N179 Acute kidney failure, unspecified: Secondary | ICD-10-CM | POA: Diagnosis not present

## 2018-03-25 DIAGNOSIS — Z79890 Hormone replacement therapy (postmenopausal): Secondary | ICD-10-CM | POA: Diagnosis not present

## 2018-03-25 DIAGNOSIS — Z9049 Acquired absence of other specified parts of digestive tract: Secondary | ICD-10-CM | POA: Diagnosis not present

## 2018-03-25 LAB — EXPECTORATED SPUTUM ASSESSMENT W GRAM STAIN, RFLX TO RESP C

## 2018-03-25 LAB — CBC
HCT: 36.9 % (ref 35.0–47.0)
HEMOGLOBIN: 12.4 g/dL (ref 12.0–16.0)
MCH: 31.7 pg (ref 26.0–34.0)
MCHC: 33.6 g/dL (ref 32.0–36.0)
MCV: 94.5 fL (ref 80.0–100.0)
Platelets: 296 10*3/uL (ref 150–440)
RBC: 3.9 MIL/uL (ref 3.80–5.20)
RDW: 13.1 % (ref 11.5–14.5)
WBC: 15.6 10*3/uL — AB (ref 3.6–11.0)

## 2018-03-25 LAB — EXPECTORATED SPUTUM ASSESSMENT W REFEX TO RESP CULTURE

## 2018-03-25 LAB — CREATININE, SERUM
CREATININE: 1.13 mg/dL — AB (ref 0.44–1.00)
GFR calc Af Amer: 51 mL/min — ABNORMAL LOW (ref >60)
GFR calc non Af Amer: 44 mL/min — ABNORMAL LOW (ref >60)

## 2018-03-25 LAB — STREP PNEUMONIAE URINARY ANTIGEN: Strep Pneumo Urinary Antigen: NEGATIVE

## 2018-03-25 MED ORDER — ALBUTEROL SULFATE HFA 108 (90 BASE) MCG/ACT IN AERS: 2.0000 | INHALATION_SPRAY | Freq: Four times a day (QID) | RESPIRATORY_TRACT | 2 refills | Status: DC | PRN

## 2018-03-25 NOTE — Progress Notes (Signed)
Discharge instructions along with home medications and follow up gone over with patient and daughter. Both verbalize that they understood instructions. No prescriptions given to patient. IV removed. Pt being discharged home on room air, no distress noted. Bay Jarquin S Fenton, RN 

## 2018-03-25 NOTE — Care Management Obs Status (Signed)
MEDICARE OBSERVATION STATUS NOTIFICATION   Patient Details  Name: Melissa Anthony MRN: [id number] Date of Birth: [birth year]   Medicare Observation Status Notification Given:  No  Notified by UR of code 44. CM in patient's room within 5 minutes of UR notification and patient already left the unit  Eber Hong, RN 03/25/2018, 3:02 PM

## 2018-03-25 NOTE — Care Management CC44 (Signed)
Condition Code 44 Documentation Completed  Patient Details  Name: Melissa Anthony MRN: [id number] Date of Birth: 1934-04-29   Condition Code 44 given:    Patient signature on Condition Code 44 notice:    Documentation of 2 MD's agreement:  Yes Code 44 added to claim:       Eber Hong, RN 03/25/2018, 3:03 PM

## 2018-03-25 NOTE — Progress Notes (Signed)
On admission for navigator questions pt denies stress, denies seeking psychiatric help for depression or anxiety and denies any depression or anxiety at the moment. Pt discussed her husband being ill but states she recognizes that is the way it is when one grows older. Pt friendly and appropriate. Henriette Combs RN

## 2018-03-26 LAB — HIV ANTIBODY (ROUTINE TESTING W REFLEX): HIV Screen 4th Generation wRfx: NONREACTIVE

## 2018-03-26 LAB — LEGIONELLA PNEUMOPHILA SEROGP 1 UR AG: L. pneumophila Serogp 1 Ur Ag: NEGATIVE

## 2018-03-26 NOTE — Discharge Summary (Signed)
Melissa Anthony, is a 84 y.o. female  DOB [birth year]  MRN [id number].  Admission date:  03/24/2018  Admitting Physician  Bertrum Sol, MD  Discharge Date:  03/25/2018   Primary MD  Lauro Regulus, MD  Recommendations for primary care physician for things to follow:   Follow-up with PCP in 1 week  Admission Diagnosis  Acute renal insufficiency [N28.9] Atypical pneumonia [J18.9] Altered mental status, unspecified altered mental status type [R41.82]   Discharge Diagnosis  Acute renal insufficiency [N28.9] Atypical pneumonia [J18.9] Altered mental status, unspecified altered mental status type [R41.82]    Active Problems:   CAP (community acquired pneumonia)      History reviewed. No pertinent past medical history.  Past Surgical History:  Procedure Laterality Date  . ABDOMINAL HYSTERECTOMY    . APPENDECTOMY    . CHOLECYSTECTOMY    . TONSILLECTOMY         History of present illness and  Hospital Course:     Kindly see H&P for history of present illness and admission details, please review complete Labs, Consult reports and Test reports for all details in brief  HPI  from the history and physical done on the day of admission  84 year old female patient admitted for shortness of breath and found to have pneumonia.  Patient white count 16,000.  Patient chest x-ray is negative but CT chest showed right-sided pneumonia.  Patient was given antibiotics by Dr. Dareen Piano but because she felt short of breath came to emergency room. Hospital Course  #1/acute right-sided pneumonia: Mild: Admitted to medical floor, started on Rocephin, Zithromax, patient told me that she has prescription from Dr. Dareen Piano for amoxicillin, erythromycin I advised her to continue and finish that.   For cough she is given prescription for  albuterol.  She does have Robitussin that she can take for cough.  Patient also got steroid dose taper by PCP and advised her to continue and finish it. Acute kidney injury due to dehydration: Improved.  Gotten improved from 1.39-1.13. 3.  Mild hypokalemia: Improved with supplements. 4.  Acute distress: Patient told me that her husband diagnosed with bone cancer, in the process of getting chemotherapy.    #5 /depression: Continue Zoloft.  Discharge Condition:stable   Follow UP  Follow-up Information    Lauro Regulus, MD. Go on 03/27/2018.   Specialty:  Internal Medicine Why:  @ 11am Contact information: [address] [facility] [place] [address] [phone]             Discharge Instructions  and  Discharge Medications      Allergies as of 03/25/2018   No Known Allergies     Medication List    TAKE these medications   acetaminophen 650 MG CR tablet Commonly known as:  TYLENOL Take 650 mg by mouth every 8 (eight) hours as needed for pain.   albuterol 108 (90 Base) MCG/ACT inhaler Commonly known as:  PROVENTIL HFA;VENTOLIN HFA Inhale 2 puffs into the lungs every 6 (six) hours as needed for wheezing or shortness of breath.   amoxicillin 875 MG tablet Commonly known as:  AMOXIL Take 1 tablet by mouth 2 (two) times daily.   ascorbic acid 500 MG tablet Commonly known as:  VITAMIN C Take 500 mg by mouth daily.   azithromycin 500 MG tablet Commonly known as:  ZITHROMAX Take 1 tablet by mouth daily.   cholecalciferol 1000 units tablet Commonly known as:  VITAMIN D Take 1,000  Units by mouth daily.   estradiol 1 MG tablet Commonly known as:  ESTRACE Take 1 mg by mouth daily.   guaiFENesin-codeine 100-10 MG/5ML syrup Commonly known as:  ROBITUSSIN AC Take 5 mLs by mouth 3 (three) times daily as needed for cough.   multivitamin with minerals Tabs tablet Take 1 tablet by mouth daily.   naproxen sodium 220 MG tablet Commonly  known as:  ALEVE Take 220 mg by mouth 2 (two) times daily as needed (pain).   predniSONE 20 MG tablet Commonly known as:  DELTASONE Take 3 tablets by mouth daily for 3 days, two daily for two days then take 1 tablet by mouth daily for 3 days   ranitidine 150 MG tablet Commonly known as:  ZANTAC Take 1 tablet by mouth daily.   sertraline 50 MG tablet Commonly known as:  ZOLOFT Take 1 tablet by mouth daily.         Diet and Activity recommendation: See Discharge Instructions above   Consults obtained -none   Major procedures and Radiology Reports - PLEASE review detailed and final reports for all details, in brief -      Dg Chest 2 View  Result Date: 03/24/2018 CLINICAL DATA:  Shortness of breath. EXAM: CHEST - 2 VIEW COMPARISON:  None available currently. FINDINGS: The heart size and mediastinal contours are within normal limits. Both lungs are clear. No pneumothorax or pleural effusion is noted. The visualized skeletal structures are unremarkable. IMPRESSION: No active cardiopulmonary disease. Electronically Signed   By: Lupita Raider, M.D.   On: 03/24/2018 18:37   Ct Chest Wo Contrast  Result Date: 03/24/2018 CLINICAL DATA:  Shortness of breath. EXAM: CT CHEST WITHOUT CONTRAST TECHNIQUE: Multidetector CT imaging of the chest was performed following the standard protocol without IV contrast. COMPARISON:  Radiographs of March 24, 2018. FINDINGS: Cardiovascular: Atherosclerosis of thoracic aorta is noted without aneurysm formation. Coronary artery calcifications are noted. Normal cardiac size. No pericardial effusion. Mediastinum/Nodes: No enlarged mediastinal or axillary lymph nodes. Thyroid gland, trachea, and esophagus demonstrate no significant findings. Lungs/Pleura: No pneumothorax or pleural effusion is noted. Mild biapical scarring. Mild right basilar opacity is noted most consistent with pneumonia. Upper Abdomen: No acute abnormality. Musculoskeletal: No chest wall mass  or suspicious bone lesions identified. IMPRESSION: Mild right basilar opacity is noted concerning for pneumonia. Coronary artery calcifications are noted suggesting coronary artery disease. Aortic Atherosclerosis (ICD10-I70.0). Electronically Signed   By: Lupita Raider, M.D.   On: 03/24/2018 20:15    Micro Results     Recent Results (from the past 240 hour(s))  Culture, blood (routine x 2) Call MD if unable to obtain prior to antibiotics being given     Status: None (Preliminary result)   Collection Time: 03/24/18 11:18 PM  Result Value Ref Range Status   Specimen Description BLOOD RIGHT [facility]  Final   Special Requests   Final    BOTTLES DRAWN AEROBIC AND ANAEROBIC Blood Culture adequate volume   Culture   Final    NO GROWTH 2 DAYS Performed at [facility], [address] [place], [place] [address]    Report Status PENDING  Incomplete  Culture, blood (routine x 2) Call MD if unable to obtain prior to antibiotics being given     Status: None (Preliminary result)   Collection Time: 03/24/18 11:30 PM  Result Value Ref Range Status   Specimen Description BLOOD RIGHT HAND  Final   Special Requests   Final  BOTTLES DRAWN AEROBIC AND ANAEROBIC Blood Culture adequate volume   Culture   Final    NO GROWTH 2 DAYS Performed at [facility], [address] [place], [place] [address]    Report Status PENDING  Incomplete  Culture, sputum-assessment     Status: None   Collection Time: 03/25/18  4:58 AM  Result Value Ref Range Status   Specimen Description SPUTUM  Final   Special Requests NONE  Final   Sputum evaluation   Final    Sputum specimen not acceptable for testing.  Please recollect.   REQUEST FOR RECOLLECT CALLED TO MADDIE FENTON AT 6045 ON 03/25/18 [facility]. Performed at [facility], [address], [place], [place] [address]    Report Status 03/25/2018 FINAL  Final       Today   Subjective:   Riyana Biel today has no headache,no chest  abdominal pain,no new weakness tingling or numbness, feels much better wants to go home today.  Objective:   Blood pressure 113/60, pulse 66, temperature 97.9 F (36.6 C), temperature source Oral, resp. rate 18, height  (1.626 m), weight 74.8 kg (165 lb), SpO2 100 %.  No intake or output data in the 24 hours ending 03/26/18 1602  Exam Awake Alert, Oriented x 3, No new F.N deficits, Normal affect [place].AT,PERRAL Supple Neck,No JVD, No cervical lymphadenopathy appriciated.  Symmetrical Chest wall movement, Good air movement bilaterally, CTAB RRR,No Gallops,Rubs or new Murmurs, No Parasternal Heave +ve B.Sounds, Abd Soft, Non tender, No organomegaly appriciated, No rebound -guarding or rigidity. No Cyanosis, Clubbing or edema, No new Rash or bruise  Data Review   CBC w Diff:  Lab Results  Component Value Date   WBC 15.6 (H) 03/24/2018   HGB 12.4 03/24/2018   HGB 10.4 (L) 09/16/2012   HCT 36.9 03/24/2018   HCT 37.2 08/31/2012   PLT 296 03/24/2018   PLT 232 09/16/2012   LYMPHOPCT 7 03/24/2018   MONOPCT 5 03/24/2018   EOSPCT 0 03/24/2018   BASOPCT 0 03/24/2018    CMP:  Lab Results  Component Value Date   NA 138 03/24/2018   NA 142 09/16/2012   K 3.4 (L) 03/24/2018   K 3.8 09/16/2012   CL 104 03/24/2018   CL 108 (H) 09/16/2012   CO2 23 03/24/2018   CO2 26 09/16/2012   BUN 26 (H) 03/24/2018   BUN 11 09/16/2012   CREATININE 1.13 (H) 03/24/2018   CREATININE 0.72 09/16/2012  .   Total Time in preparing paper work, data evaluation and todays exam - 35 minutes  Katha Hamming M.D on 03/25/2018 at 4:02 PM    Note: This dictation was prepared with Dragon dictation along with smaller phrase technology. Any transcriptional errors that result from this process are unintentional.

## 2018-03-29 LAB — CULTURE, BLOOD (ROUTINE X 2)
CULTURE: NO GROWTH
CULTURE: NO GROWTH
SPECIAL REQUESTS: ADEQUATE
Special Requests: ADEQUATE

## 2018-06-11 ENCOUNTER — Encounter: Payer: Self-pay | Admitting: *Deleted

## 2018-06-16 ENCOUNTER — Ambulatory Visit: Payer: Medicare HMO | Admitting: Anesthesiology

## 2018-06-16 ENCOUNTER — Encounter
Admission: RE | Disposition: A | Discharge status: Home / Self Care | Payer: Self-pay | Source: Ambulatory Visit | Attending: Ophthalmology

## 2018-06-16 ENCOUNTER — Encounter: Payer: Self-pay | Admitting: Emergency Medicine

## 2018-06-16 ENCOUNTER — Ambulatory Visit
Admission: RE | Admit: 2018-06-16 | Discharge: 2018-06-16 | Disposition: A | Discharge status: Home / Self Care | Payer: Medicare HMO | Source: Ambulatory Visit | Attending: Ophthalmology | Admitting: Ophthalmology

## 2018-06-16 DIAGNOSIS — Z79899 Other long term (current) drug therapy: Secondary | ICD-10-CM | POA: Diagnosis not present

## 2018-06-16 DIAGNOSIS — K219 Gastro-esophageal reflux disease without esophagitis: Secondary | ICD-10-CM | POA: Diagnosis not present

## 2018-06-16 DIAGNOSIS — F419 Anxiety disorder, unspecified: Secondary | ICD-10-CM | POA: Insufficient documentation

## 2018-06-16 DIAGNOSIS — Z96659 Presence of unspecified artificial knee joint: Secondary | ICD-10-CM | POA: Insufficient documentation

## 2018-06-16 DIAGNOSIS — F329 Major depressive disorder, single episode, unspecified: Secondary | ICD-10-CM | POA: Diagnosis not present

## 2018-06-16 DIAGNOSIS — H2512 Age-related nuclear cataract, left eye: Secondary | ICD-10-CM | POA: Diagnosis not present

## 2018-06-16 DIAGNOSIS — Z79890 Hormone replacement therapy (postmenopausal): Secondary | ICD-10-CM | POA: Diagnosis not present

## 2018-06-16 HISTORY — DX: Major depressive disorder, single episode, unspecified: F32.9

## 2018-06-16 HISTORY — PX: CATARACT EXTRACTION W/PHACO: SHX586

## 2018-06-16 HISTORY — DX: Anxiety disorder, unspecified: F41.9

## 2018-06-16 HISTORY — DX: Gastro-esophageal reflux disease without esophagitis: K21.9

## 2018-06-16 HISTORY — DX: Depression, unspecified: F32.A

## 2018-06-16 HISTORY — DX: Unspecified osteoarthritis, unspecified site: M19.90

## 2018-06-16 SURGERY — PHACOEMULSIFICATION, CATARACT, WITH IOL INSERTION
Anesthesia: Monitor Anesthesia Care | Site: Eye | Laterality: Left | Wound class: "Clean "

## 2018-06-16 MED ORDER — MIDAZOLAM HCL 2 MG/2ML IJ SOLN
INTRAMUSCULAR | Status: AC
  Filled 2018-06-16: qty 2

## 2018-06-16 MED ORDER — NA CHONDROIT SULF-NA HYALURON 40-17 MG/ML IO SOLN
INTRAOCULAR | Status: AC
  Filled 2018-06-16: qty 1

## 2018-06-16 MED ORDER — MIDAZOLAM HCL 5 MG/5ML IJ SOLN
INTRAMUSCULAR | Status: DC | PRN
  Administered 2018-06-16: 2 mg via INTRAVENOUS

## 2018-06-16 MED ORDER — SODIUM CHLORIDE 0.9 % IV SOLN
INTRAVENOUS | Status: DC
  Administered 2018-06-16: 10:00:00 via INTRAVENOUS

## 2018-06-16 MED ORDER — PHENYLEPHRINE HCL 10 % OP SOLN
OPHTHALMIC | Status: AC
  Administered 2018-06-16: 1 [drp] via OPHTHALMIC
  Filled 2018-06-16: qty 5

## 2018-06-16 MED ORDER — POVIDONE-IODINE 5 % OP SOLN
OPHTHALMIC | Status: AC
  Filled 2018-06-16: qty 30

## 2018-06-16 MED ORDER — MOXIFLOXACIN HCL 0.5 % OP SOLN
OPHTHALMIC | Status: AC
  Filled 2018-06-16: qty 3

## 2018-06-16 MED ORDER — LIDOCAINE HCL (PF) 4 % IJ SOLN
INTRAOCULAR | Status: DC | PRN
  Administered 2018-06-16: 4 mL via OPHTHALMIC

## 2018-06-16 MED ORDER — CARBACHOL 0.01 % IO SOLN
INTRAOCULAR | Status: DC | PRN
  Administered 2018-06-16: 0.5 mL via INTRAOCULAR

## 2018-06-16 MED ORDER — CYCLOPENTOLATE HCL 2 % OP SOLN
1.0000 [drp] | OPHTHALMIC | Status: AC
  Administered 2018-06-16 (×4): 1 [drp] via OPHTHALMIC

## 2018-06-16 MED ORDER — TETRACAINE HCL 0.5 % OP SOLN
OPHTHALMIC | Status: AC
  Administered 2018-06-16: 1 [drp] via OPHTHALMIC
  Filled 2018-06-16: qty 4

## 2018-06-16 MED ORDER — CYCLOPENTOLATE HCL 2 % OP SOLN
OPHTHALMIC | Status: AC
  Administered 2018-06-16: 1 [drp] via OPHTHALMIC
  Filled 2018-06-16: qty 2

## 2018-06-16 MED ORDER — NA CHONDROIT SULF-NA HYALURON 40-17 MG/ML IO SOLN
INTRAOCULAR | Status: DC | PRN
  Administered 2018-06-16: 1 mL via INTRAOCULAR

## 2018-06-16 MED ORDER — POVIDONE-IODINE 5 % OP SOLN
OPHTHALMIC | Status: DC | PRN
  Administered 2018-06-16: 1 via OPHTHALMIC

## 2018-06-16 MED ORDER — MOXIFLOXACIN HCL 0.5 % OP SOLN
OPHTHALMIC | Status: DC | PRN
  Administered 2018-06-16: 0.2 mL via OPHTHALMIC

## 2018-06-16 MED ORDER — EPINEPHRINE PF 1 MG/ML IJ SOLN
INTRAMUSCULAR | Status: DC | PRN
  Administered 2018-06-16: 11:00:00 via OPHTHALMIC

## 2018-06-16 MED ORDER — MOXIFLOXACIN HCL 0.5 % OP SOLN: 1.0000 [drp] | OPHTHALMIC | Status: DC | PRN

## 2018-06-16 MED ORDER — EPINEPHRINE PF 1 MG/ML IJ SOLN
INTRAMUSCULAR | Status: AC
  Filled 2018-06-16: qty 2

## 2018-06-16 MED ORDER — LIDOCAINE HCL (PF) 4 % IJ SOLN
INTRAMUSCULAR | Status: AC
Start: 2018-06-16 — End: ?
  Filled 2018-06-16: qty 5

## 2018-06-16 MED ORDER — PHENYLEPHRINE HCL 10 % OP SOLN
1.0000 [drp] | OPHTHALMIC | Status: AC
  Administered 2018-06-16 (×4): 1 [drp] via OPHTHALMIC

## 2018-06-16 MED ORDER — TETRACAINE HCL 0.5 % OP SOLN
1.0000 [drp] | Freq: Once | OPHTHALMIC | Status: AC
  Administered 2018-06-16 (×2): 1 [drp] via OPHTHALMIC

## 2018-06-16 SURGICAL SUPPLY — 18 items
GLOVE BIO SURGEON STRL SZ8 (GLOVE) ×3 IMPLANT
GLOVE BIOGEL M 6.5 STRL (GLOVE) ×3 IMPLANT
GLOVE SURG LX 8.0 MICRO (GLOVE) ×2
GLOVE SURG LX STRL 8.0 MICRO (GLOVE) ×1 IMPLANT
GOWN STRL REUS W/ TWL LRG LVL3 (GOWN DISPOSABLE) ×2 IMPLANT
GOWN STRL REUS W/TWL LRG LVL3 (GOWN DISPOSABLE) ×4
LABEL CATARACT MEDS ST (LABEL) ×3 IMPLANT
LENS IOL ACRSF IQ TRC 5 22.0 IMPLANT
LENS IOL ACRYSOF IQ TORIC 22.0 ×2 IMPLANT
LENS IOL IQ TORIC 5 22.0 ×1 IMPLANT
PACK CATARACT (MISCELLANEOUS) ×3 IMPLANT
PACK CATARACT BRASINGTON LX (MISCELLANEOUS) ×3 IMPLANT
PACK EYE AFTER SURG (MISCELLANEOUS) ×3 IMPLANT
SOL BSS BAG (MISCELLANEOUS) ×3
SOLUTION BSS BAG (MISCELLANEOUS) ×1 IMPLANT
SYR 5ML LL (SYRINGE) ×3 IMPLANT
WATER STERILE IRR 250ML POUR (IV SOLUTION) ×3 IMPLANT
WIPE NON LINTING 3.25X3.25 (MISCELLANEOUS) ×3 IMPLANT

## 2018-06-16 NOTE — Anesthesia Postprocedure Evaluation (Signed)
Anesthesia Post Note  Patient: Melissa Anthony  Procedure(s) Performed: CATARACT EXTRACTION PHACO AND INTRAOCULAR LENS PLACEMENT (IOC) (Left Eye)  Patient location during evaluation: PACU Anesthesia Type: MAC Level of consciousness: awake and alert and oriented Pain management: pain level controlled Vital Signs Assessment: post-procedure vital signs reviewed and stable Respiratory status: spontaneous breathing, nonlabored ventilation and respiratory function stable Cardiovascular status: blood pressure returned to baseline and stable Postop Assessment: no signs of nausea or vomiting Anesthetic complications: no     Last Vitals:  Vitals:   06/16/18 0933 06/16/18 1042  BP: 124/70 (!) 100/55  Pulse: 69 68  Resp: 17 16  Temp:  36.6 C  SpO2: 100% 100%    Last Pain:  Vitals:   06/16/18 1042  TempSrc: Oral  PainSc: 0-No pain                 Desi Carby

## 2018-06-16 NOTE — Progress Notes (Signed)
Uncharted IV removed from right hand.

## 2018-06-16 NOTE — Anesthesia Post-op Follow-up Note (Signed)
Anesthesia QCDR form completed.        

## 2018-06-16 NOTE — Discharge Instructions (Signed)
Eye Surgery Discharge Instructions    Expect mild scratchy sensation or mild soreness. DO NOT RUB YOUR EYE!  The day of surgery:  Minimal physical activity, but bed rest is not required  No reading, computer work, or close hand work  No bending, lifting, or straining.  May watch TV  For 24 hours:  No driving, legal decisions, or alcoholic beverages  Safety precautions  Eat anything you prefer: It is better to start with liquids, then soup then solid foods.  _____ Eye patch should be worn until postoperative exam tomorrow.  ____ Solar shield eyeglasses should be worn for comfort in the sunlight/patch while sleeping  Resume all regular medications including aspirin or Coumadin if these were discontinued prior to surgery. You may shower, bathe, shave, or wash your hair. Tylenol may be taken for mild discomfort.  Call your doctor if you experience significant pain, nausea, or vomiting, fever > 101 or other signs of infection. [phone] or [phone] Specific instructions:  Follow-up Information    Galen ManilaPorfilio, William, MD Follow up.   Specialty:  Ophthalmology Why:  July 24 at 10:30am Contact information: [address] [place] [address] [phone]

## 2018-06-16 NOTE — Anesthesia Preprocedure Evaluation (Signed)
Anesthesia Evaluation  Patient identified by MRN, date of birth, ID band Patient awake    Reviewed: Allergy & Precautions, NPO status , Patient's Chart, lab work & pertinent test results  History of Anesthesia Complications Negative for: history of anesthetic complications  Airway Mallampati: II  TM Distance: >3 FB Neck ROM: Full    Dental  (+) Lower Dentures, Upper Dentures   Pulmonary neg pulmonary ROS, neg sleep apnea, neg COPD,    breath sounds clear to auscultation- rhonchi (-) wheezing      Cardiovascular Exercise Tolerance: Good (-) hypertension(-) CAD, (-) Past MI, (-) Cardiac Stents and (-) CABG  Rhythm:Regular Rate:Normal - Systolic murmurs and - Diastolic murmurs    Neuro/Psych PSYCHIATRIC DISORDERS Anxiety Depression negative neurological ROS     GI/Hepatic Neg liver ROS, GERD  ,  Endo/Other  negative endocrine ROSneg diabetes  Renal/GU negative Renal ROS     Musculoskeletal  (+) Arthritis ,   Abdominal (+) - obese,   Peds  Hematology negative hematology ROS (+)   Anesthesia Other Findings Past Medical History: No date: Anxiety No date: Arthritis No date: Depression No date: GERD (gastroesophageal reflux disease)   Reproductive/Obstetrics                             Anesthesia Physical Anesthesia Plan  ASA: II  Anesthesia Plan: MAC   Post-op Pain Management:    Induction: Intravenous  PONV Risk Score and Plan: 2 and Midazolam  Airway Management Planned: Natural Airway  Additional Equipment:   Intra-op Plan:   Post-operative Plan:   Informed Consent: I have reviewed the patients History and Physical, chart, labs and discussed the procedure including the risks, benefits and alternatives for the proposed anesthesia with the patient or authorized representative who has indicated his/her understanding and acceptance.     Plan Discussed with: CRNA and  Anesthesiologist  Anesthesia Plan Comments:         Anesthesia Quick Evaluation

## 2018-06-16 NOTE — Op Note (Signed)
PREOPERATIVE DIAGNOSIS:  Nuclear sclerotic cataract of the left eye.   POSTOPERATIVE DIAGNOSIS:  Nuclear sclerotic cataract of the left eye.   OPERATIVE PROCEDURE: Procedure(s): CATARACT EXTRACTION PHACO AND INTRAOCULAR LENS PLACEMENT (IOC)   SURGEON:  Galen ManilaWilliam Mattilyn Crites, MD.   ANESTHESIA: 1.      Managed anesthesia care. 2.     0.601ml os Shugarcaine was instilled following the paracentesis [email or web address]@   COMPLICATIONS:  None.   TECHNIQUE:   Stop and chop    DESCRIPTION OF PROCEDURE:  The patient was examined and consented in the preoperative holding area where the aforementioned topical anesthesia was applied to the left eye.  The patient was brought back to the Operating Room where he was sat upright on the gurney and given a target to fixate upon while the eye was marked at the 3:00 and 9:00 position.  The patient was then reclined on the operating table.  The eye was prepped and draped in the usual sterile ophthalmic fashion and a lid speculum was placed. A paracentesis was created with the side port blade and the anterior chamber was filled with viscoelastic. A near clear corneal incision was performed with the steel keratome. A continuous curvilinear capsulorrhexis was performed with a cystotome followed by the capsulorrhexis forceps. Hydrodissection and hydrodelineation were carried out with BSS on a blunt cannula. The lens was removed in a stop and chop technique and the remaining cortical material was removed with the irrigation-aspiration handpiece. The eye was inflated with viscoelastic and the ZCT lens was placed in the eye and rotated to within a few degrees of the predetermined orientation.  The remaining viscoelastic was removed from the eye.  The Sinskey hook was used to rotate the toric lens into its final resting place at 154  degrees.  0.1 ml of Vigamox was placed in the anterior chamber. The eye was inflated to a physiologic pressure and found to be watertight.  The eye was  dressed with Vigamox. The patient was given protective glasses to wear throughout the day and a shield with which to sleep tonight. The patient was also given drops with which to begin a drop regimen today and will follow-up with me in one day. Implant Name Type Inv. Item Serial No. Manufacturer Lot No. LRB No. Used  LENS IOL TORIC 22.0 - [id number]  LENS IOL TORIC 22.0 [id number] 072 [employer]  Left 1   Procedure(s) with comments: CATARACT EXTRACTION PHACO AND INTRAOCULAR LENS PLACEMENT (IOC) (Left) - US 00:45.6 AP% 19.7 CDE 8.96 Fluid Pack lot # [id number]  Electronically signed: Galen ManilaWilliam Caliya Narine 7/23/201910:45 AM

## 2018-06-16 NOTE — H&P (Signed)
All labs reviewed. Abnormal studies sent to patients PCP when indicated.  Previous H&P reviewed, patient examined, there are NO CHANGES.  Melissa Gundrum Porfilio7/23/201910:17 AM

## 2018-06-16 NOTE — Transfer of Care (Signed)
Immediate Anesthesia Transfer of Care Note  Patient: Melissa Anthony  Procedure(s) Performed: CATARACT EXTRACTION PHACO AND INTRAOCULAR LENS PLACEMENT (IOC) (Left Eye)  Patient Location: PACU  Anesthesia Type:MAC  Level of Consciousness: sedated  Airway & Oxygen Therapy: Patient Spontanous Breathing  Post-op Assessment: Report given to RN  Post vital signs: Reviewed and stable  Last Vitals:  Vitals Value Taken Time  BP    Temp    Pulse    Resp    SpO2      Last Pain:  Vitals:   06/16/18 0933  TempSrc:   PainSc: 0-No pain         Complications: No apparent anesthesia complications

## 2018-11-25 HISTORY — PX: EYE SURGERY: SHX253

## 2019-04-12 ENCOUNTER — Other Ambulatory Visit: Payer: Self-pay | Admitting: Internal Medicine

## 2019-04-12 DIAGNOSIS — W19XXXA Unspecified fall, initial encounter: Secondary | ICD-10-CM

## 2019-04-22 ENCOUNTER — Ambulatory Visit
Admission: RE | Admit: 2019-04-22 | Discharge: 2019-04-22 | Disposition: A | Discharge status: Home / Self Care | Payer: Medicare Other | Source: Ambulatory Visit | Attending: Internal Medicine | Admitting: Internal Medicine

## 2019-04-22 ENCOUNTER — Other Ambulatory Visit: Payer: Self-pay | Admitting: Internal Medicine

## 2019-04-22 ENCOUNTER — Other Ambulatory Visit: Payer: Self-pay

## 2019-04-22 ENCOUNTER — Encounter: Payer: Self-pay | Admitting: Radiology

## 2019-04-22 DIAGNOSIS — W19XXXA Unspecified fall, initial encounter: Secondary | ICD-10-CM | POA: Diagnosis not present

## 2019-04-22 DIAGNOSIS — M50223 Other cervical disc displacement at C6-C7 level: Secondary | ICD-10-CM | POA: Diagnosis present

## 2019-06-15 ENCOUNTER — Emergency Department
Admission: EM | Admit: 2019-06-15 | Discharge: 2019-06-15 | Disposition: A | Discharge status: Home / Self Care | Payer: Medicare Other | Attending: Student in an Organized Health Care Education/Training Program | Admitting: Student in an Organized Health Care Education/Training Program

## 2019-06-15 ENCOUNTER — Encounter: Payer: Self-pay | Admitting: Intensive Care

## 2019-06-15 ENCOUNTER — Other Ambulatory Visit: Payer: Self-pay

## 2019-06-15 DIAGNOSIS — W208XXA Other cause of strike by thrown, projected or falling object, initial encounter: Secondary | ICD-10-CM | POA: Diagnosis not present

## 2019-06-15 DIAGNOSIS — S41112A Laceration without foreign body of left upper arm, initial encounter: Secondary | ICD-10-CM

## 2019-06-15 DIAGNOSIS — Y92018 Other place in single-family (private) house as the place of occurrence of the external cause: Secondary | ICD-10-CM | POA: Diagnosis not present

## 2019-06-15 DIAGNOSIS — Y9301 Activity, walking, marching and hiking: Secondary | ICD-10-CM | POA: Insufficient documentation

## 2019-06-15 DIAGNOSIS — Y999 Unspecified external cause status: Secondary | ICD-10-CM | POA: Diagnosis not present

## 2019-06-15 DIAGNOSIS — Z23 Encounter for immunization: Secondary | ICD-10-CM | POA: Insufficient documentation

## 2019-06-15 DIAGNOSIS — S51812A Laceration without foreign body of left forearm, initial encounter: Secondary | ICD-10-CM | POA: Insufficient documentation

## 2019-06-15 DIAGNOSIS — Z87891 Personal history of nicotine dependence: Secondary | ICD-10-CM | POA: Insufficient documentation

## 2019-06-15 MED ORDER — LIDOCAINE HCL (PF) 1 % IJ SOLN
INTRAMUSCULAR | Status: AC
  Administered 2019-06-15: 5 mL
  Filled 2019-06-15: qty 5

## 2019-06-15 MED ORDER — LIDOCAINE HCL (PF) 1 % IJ SOLN
5.0000 mL | Freq: Once | INTRAMUSCULAR | Status: AC
  Administered 2019-06-15: 5 mL
  Filled 2019-06-15: qty 5

## 2019-06-15 MED ORDER — TETANUS-DIPHTH-ACELL PERTUSSIS 5-2.5-18.5 LF-MCG/0.5 IM SUSP
0.5000 mL | Freq: Once | INTRAMUSCULAR | Status: AC
  Administered 2019-06-15: 0.5 mL via INTRAMUSCULAR
  Filled 2019-06-15: qty 0.5

## 2019-06-15 NOTE — ED Triage Notes (Signed)
Patient presents with Left arm laceration from her screen door

## 2019-06-15 NOTE — Discharge Instructions (Signed)
You have had your arm laceration repaired with sutures. Keep the area clean, dry, and covered. Wash only with soap & water. See Dr. Ouida Sills in 10 days for suture removal.

## 2019-06-15 NOTE — ED Notes (Signed)
See triage note  Presents with laceration to left elbow area.  States her arm caught the screen door

## 2019-06-15 NOTE — ED Provider Notes (Addendum)
[facility] Emergency Department Provider Note ____________________________________________  Time seen: 1630  I have reviewed the triage vital signs and the nursing notes.  HISTORY  Chief Complaint  Extremity Laceration  HPI Melissa Anthony is a 85 y.o. female presents to the ED for evaluation management of an accidental laceration to her left arm. She was coming into the front door, when the spring on the screen door broke loose. It cut her across the proximal forearm. She presents to wound care management. She is unsure of her current tetanus status.   Past Medical History:  Diagnosis Date  . Anxiety   . Arthritis   . Depression   . GERD (gastroesophageal reflux disease)     Patient Active Problem List   Diagnosis Date Noted  . CAP (community acquired pneumonia) 03/24/2018    Past Surgical History:  Procedure Laterality Date  . 23 GAUGE PARS PLANA VITRECTOMY WITH 23 GAUGE MVR PORT    . 23 GAUGE PARS PLANA VITRECTOMY WITH 23 GAUGE MVR PORT    . 23 GAUGE PARS PLANA VITRECTOMY WITH 23 GAUGE MVR PORT    . 23 GAUGE PARS PLANA VITRECTOMY WITH 23 GAUGE MVR PORT    . 23 GAUGE PARS PLANA VITRECTOMY WITH 23 GAUGE MVR PORT    . 23 GAUGE PARS PLANA VITRECTOMY WITH 23 GAUGE MVR PORT    . 23 GAUGE PARS PLANA VITRECTOMY WITH 23 GAUGE MVR PORT    . 23 GAUGE PARS PLANA VITRECTOMY WITH 23 GAUGE MVR PORT    . 23 GAUGE PARS PLANA VITRECTOMY WITH 23 GAUGE MVR PORT    . 23 GAUGE PARS PLANA VITRECTOMY WITH 23 GAUGE MVR PORT    . 23 GAUGE PARS PLANA VITRECTOMY WITH 23 GAUGE MVR PORT    . 23 GAUGE PARS PLANA VITRECTOMY WITH 23 GAUGE MVR PORT    . 23 GAUGE PARS PLANA VITRECTOMY WITH 23 GAUGE MVR PORT    . 23 GAUGE PARS PLANA VITRECTOMY WITH 23 GAUGE MVR PORT    . 23 GAUGE PARS PLANA VITRECTOMY WITH 23 GAUGE MVR PORT    . 23 GAUGE PARS PLANA VITRECTOMY WITH 23 GAUGE MVR PORT    . 23 GAUGE PARS PLANA VITRECTOMY WITH 23 GAUGE MVR PORT    . 23 GAUGE PARS PLANA VITRECTOMY WITH  23 GAUGE MVR PORT    . 23 GAUGE PARS PLANA VITRECTOMY WITH 23 GAUGE MVR PORT    . 23 GAUGE PARS PLANA VITRECTOMY WITH 23 GAUGE MVR PORT    . 23 GAUGE PARS PLANA VITRECTOMY WITH 23 GAUGE MVR PORT    . 23 GAUGE PARS PLANA VITRECTOMY WITH 23 GAUGE MVR PORT    . 23 GAUGE PARS PLANA VITRECTOMY WITH 23 GAUGE MVR PORT    . 23 GAUGE PARS PLANA VITRECTOMY WITH 23 GAUGE MVR PORT    . 23 GAUGE PARS PLANA VITRECTOMY WITH 23 GAUGE MVR PORT    . 23 GAUGE PARS PLANA VITRECTOMY WITH 23 GAUGE MVR PORT    . 23 GAUGE PARS PLANA VITRECTOMY WITH 23 GAUGE MVR PORT    . 23 GAUGE PARS PLANA VITRECTOMY WITH 23 GAUGE MVR PORT    . 23 GAUGE PARS PLANA VITRECTOMY WITH 23 GAUGE MVR PORT    . 23 GAUGE PARS PLANA VITRECTOMY WITH 23 GAUGE MVR PORT    . 23 GAUGE PARS PLANA VITRECTOMY WITH 23 GAUGE MVR PORT    . 23 GAUGE PARS PLANA VITRECTOMY WITH 23 GAUGE MVR PORT    .  23 GAUGE PARS PLANA VITRECTOMY WITH 23 GAUGE MVR PORT    . 23 GAUGE PARS PLANA VITRECTOMY WITH 23 GAUGE MVR PORT    . 23 GAUGE PARS PLANA VITRECTOMY WITH 23 GAUGE MVR PORT    . 23 GAUGE PARS PLANA VITRECTOMY WITH 23 GAUGE MVR PORT    . 23 GAUGE PARS PLANA VITRECTOMY WITH 23 GAUGE MVR PORT    . 23 GAUGE PARS PLANA VITRECTOMY WITH 23 GAUGE MVR PORT    . 23 GAUGE PARS PLANA VITRECTOMY WITH 23 GAUGE MVR PORT    . 23 GAUGE PARS PLANA VITRECTOMY WITH 23 GAUGE MVR PORT    . 23 GAUGE PARS PLANA VITRECTOMY WITH 23 GAUGE MVR PORT    . 23 GAUGE PARS PLANA VITRECTOMY WITH 23 GAUGE MVR PORT    . 23 GAUGE PARS PLANA VITRECTOMY WITH 23 GAUGE MVR PORT    . 23 GAUGE PARS PLANA VITRECTOMY WITH 23 GAUGE MVR PORT    . 23 GAUGE PARS PLANA VITRECTOMY WITH 23 GAUGE MVR PORT    . 23 GAUGE PARS PLANA VITRECTOMY WITH 23 GAUGE MVR PORT    . 23 GAUGE PARS PLANA VITRECTOMY WITH 23 GAUGE MVR PORT    . 23 GAUGE PARS PLANA VITRECTOMY WITH 23 GAUGE MVR PORT    . 23 GAUGE PARS PLANA VITRECTOMY WITH 23 GAUGE MVR PORT    . 23 GAUGE PARS PLANA VITRECTOMY WITH 23 GAUGE MVR PORT    . 23  GAUGE PARS PLANA VITRECTOMY WITH 23 GAUGE MVR PORT    . 23 GAUGE PARS PLANA VITRECTOMY WITH 23 GAUGE MVR PORT    . 23 GAUGE PARS PLANA VITRECTOMY WITH 23 GAUGE MVR PORT    . 23 GAUGE PARS PLANA VITRECTOMY WITH 23 GAUGE MVR PORT    . 23 GAUGE PARS PLANA VITRECTOMY WITH 23 GAUGE MVR PORT    . 23 GAUGE PARS PLANA VITRECTOMY WITH 23 GAUGE MVR PORT    . 23 GAUGE PARS PLANA VITRECTOMY WITH 23 GAUGE MVR PORT    . 23 GAUGE PARS PLANA VITRECTOMY WITH 23 GAUGE MVR PORT    . 23 GAUGE PARS PLANA VITRECTOMY WITH 23 GAUGE MVR PORT    . 23 GAUGE PARS PLANA VITRECTOMY WITH 23 GAUGE MVR PORT    . 23 GAUGE PARS PLANA VITRECTOMY WITH 23 GAUGE MVR PORT    . 23 GAUGE PARS PLANA VITRECTOMY WITH 23 GAUGE MVR PORT    . 23 GAUGE PARS PLANA VITRECTOMY WITH 23 GAUGE MVR PORT    . 23 GAUGE PARS PLANA VITRECTOMY WITH 23 GAUGE MVR PORT    . 23 GAUGE PARS PLANA VITRECTOMY WITH 23 GAUGE MVR PORT    . 23 GAUGE PARS PLANA VITRECTOMY WITH 23 GAUGE MVR PORT    . 23 GAUGE PARS PLANA VITRECTOMY WITH 23 GAUGE MVR PORT    . 23 GAUGE PARS PLANA VITRECTOMY WITH 23 GAUGE MVR PORT    . 23 GAUGE PARS PLANA VITRECTOMY WITH 23 GAUGE MVR PORT    . 23 GAUGE PARS PLANA VITRECTOMY WITH 23 GAUGE MVR PORT    . 23 GAUGE PARS PLANA VITRECTOMY WITH 23 GAUGE MVR PORT    . 23 GAUGE PARS PLANA VITRECTOMY WITH 23 GAUGE MVR PORT    . 23 GAUGE PARS PLANA VITRECTOMY WITH 23 GAUGE MVR PORT    . 23 GAUGE PARS PLANA VITRECTOMY WITH 23 GAUGE MVR PORT    . 23 GAUGE PARS PLANA VITRECTOMY WITH 23 GAUGE MVR PORT    .  23 GAUGE PARS PLANA VITRECTOMY WITH 23 GAUGE MVR PORT    . 23 GAUGE PARS PLANA VITRECTOMY WITH 23 GAUGE MVR PORT    . 23 GAUGE PARS PLANA VITRECTOMY WITH 23 GAUGE MVR PORT    . 23 GAUGE PARS PLANA VITRECTOMY WITH 23 GAUGE MVR PORT    . 23 GAUGE PARS PLANA VITRECTOMY WITH 23 GAUGE MVR PORT    . 23 GAUGE PARS PLANA VITRECTOMY WITH 23 GAUGE MVR PORT    . 23 GAUGE PARS PLANA VITRECTOMY WITH 23 GAUGE MVR PORT    . 23 GAUGE PARS PLANA VITRECTOMY  WITH 23 GAUGE MVR PORT    . 23 GAUGE PARS PLANA VITRECTOMY WITH 23 GAUGE MVR PORT    . 23 GAUGE PARS PLANA VITRECTOMY WITH 23 GAUGE MVR PORT    . 23 GAUGE PARS PLANA VITRECTOMY WITH 23 GAUGE MVR PORT    . 23 GAUGE PARS PLANA VITRECTOMY WITH 23 GAUGE MVR PORT    . 23 GAUGE PARS PLANA VITRECTOMY WITH 23 GAUGE MVR PORT    . 23 GAUGE PARS PLANA VITRECTOMY WITH 23 GAUGE MVR PORT    . ABDOMINAL HYSTERECTOMY    . APPENDECTOMY    . CATARACT EXTRACTION W/PHACO Left 06/16/2018   Procedure: CATARACT EXTRACTION PHACO AND INTRAOCULAR LENS PLACEMENT (IOC);  Surgeon: Galen ManilaPorfilio, William, MD;  Location: [facility] ORS;  Service: Ophthalmology;  Laterality: Left;  US 00:45.6 AP% 19.7 CDE 8.96 Fluid Pack lot # [id number]  . CHOLECYSTECTOMY    . COLON SURGERY    . JOINT REPLACEMENT     TKR  . TONSILLECTOMY      Prior to Admission medications   Medication Sig Start Date End Date Taking? Authorizing Provider  aspirin EC 325 MG tablet Take 650 mg by mouth daily as needed for moderate pain.    [provider]  estradiol (ESTRACE) 1 MG tablet Take 1 mg by mouth daily.    [provider]  Multiple Vitamin (MULTIVITAMIN WITH MINERALS) TABS tablet Take 1 tablet by mouth daily.    [provider]  ranitidine (ZANTAC) 150 MG tablet Take 150 mg by mouth daily.  01/06/18   [provider]  sertraline (ZOLOFT) 100 MG tablet Take 100 mg by mouth daily.    [provider]    Allergies Patient has no known allergies.  History reviewed. No pertinent family history.  Social History Social History   Tobacco Use  . Smoking status: Former Games developermoker  . Smokeless tobacco: Never Used  Substance Use Topics  . Alcohol use: Never    Frequency: Never  . Drug use: Never    Review of Systems  Constitutional: Negative for fever. Eyes: Negative for visual changes. ENT: Negative for sore throat. Cardiovascular: Negative for chest pain. Respiratory: Negative for shortness of  breath. Gastrointestinal: Negative for abdominal pain, vomiting and diarrhea. Genitourinary: Negative for dysuria. Musculoskeletal: Negative for back pain. Skin: Negative for rash. Left forearm laceration.  Neurological: Negative for headaches, focal weakness or numbness. ____________________________________________  PHYSICAL EXAM:  VITAL SIGNS: ED Triage Vitals [06/15/19 1426]  Enc Vitals Group     BP (!) 143/70     Pulse Rate 96     Resp 18     Temp 98.1 F (36.7 C)     Temp Source Oral     SpO2 96 %     Weight 150 lb (68 kg)     Height 5\' 3"  (1.6 m)     Head Circumference  Peak Flow      Pain Score 0     Pain Loc      Pain Edu?      Excl. in [place]?     Constitutional: Alert and oriented. Well appearing and in no distress. Head: Normocephalic and atraumatic. Eyes: Conjunctivae are normal. Normal extraocular movements Cardiovascular: Normal rate, regular rhythm. Normal distal pulses. Respiratory: Normal respiratory effort.  Musculoskeletal: Nontender with normal range of motion in all extremities.  Neurologic:  Normal gait without ataxia. Normal speech and language. No gross focal neurologic deficits are appreciated. Skin:  Skin is warm, dry and intact. No rash noted. Psychiatric: Mood and affect are normal. Patient exhibits appropriate insight and judgment. ____________________________________________  PROCEDURES  Tdap 0.5 ml IM  .Marland KitchenLaceration Repair  Date/Time: 06/15/2019 4:17 PM Performed by: Melvenia Needles, PA-C Authorized by: Melvenia Needles, PA-C   Consent:    Consent obtained:  Verbal   Consent given by:  Patient   Risks discussed:  Pain Anesthesia (see MAR for exact dosages):    Anesthesia method:  Local infiltration   Local anesthetic:  Lidocaine 1% w/o epi Laceration details:    Location:  Shoulder/arm   Shoulder/arm location:  L lower arm   Length (cm):  4   Depth (mm):  3 Repair type:    Repair type:  Simple Pre-procedure  details:    Preparation:  Patient was prepped and draped in usual sterile fashion Treatment:    Area cleansed with:  Betadine and saline   Irrigation solution:  Sterile saline   Irrigation method:  Syringe Skin repair:    Repair method:  Sutures   Suture size:  4-0   Suture material:  Nylon   Suture technique:  Simple interrupted   Number of sutures:  4 Approximation:    Approximation:  Close Post-procedure details:    Dressing:  Non-adherent dressing   Patient tolerance of procedure:  Tolerated well, no immediate complications  ____________________________________________  INITIAL IMPRESSION / ASSESSMENT AND PLAN / ED COURSE  Melissa Anthony was evaluated in Emergency Department on 06/15/2019 for the symptoms described in the history of present illness. She was evaluated in the context of the global COVID-19 pandemic, which necessitated consideration that the patient might be at risk for infection with the SARS-CoV-2 virus that causes COVID-19. Institutional protocols and algorithms that pertain to the evaluation of patients at risk for COVID-19 are in a state of rapid change based on information released by regulatory bodies including the CDC and federal and state organizations. These policies and algorithms were followed during the patient's care in the ED.  Patient with ED evaluation of an accidental laceration to the left forearm. She is discharged after tetanus update and suture repair. She will see her PCP in 10 days for suture removal.  ____________________________________________  FINAL CLINICAL IMPRESSION(S) / ED DIAGNOSES  Final diagnoses:  Arm laceration, left, initial encounter      Melvenia Needles, PA-C 06/15/19 1644    Dominion Kathan, Dannielle Karvonen, PA-C 06/15/19 1645    Merlyn Lot, MD 06/15/19 1709

## 2019-08-20 ENCOUNTER — Other Ambulatory Visit: Payer: Self-pay | Admitting: Sports Medicine

## 2019-08-20 DIAGNOSIS — G8929 Other chronic pain: Secondary | ICD-10-CM

## 2019-08-20 DIAGNOSIS — M1712 Unilateral primary osteoarthritis, left knee: Secondary | ICD-10-CM

## 2019-09-02 ENCOUNTER — Other Ambulatory Visit: Payer: Self-pay

## 2019-09-02 ENCOUNTER — Ambulatory Visit
Admission: RE | Admit: 2019-09-02 | Discharge: 2019-09-02 | Disposition: A | Discharge status: Home / Self Care | Payer: Medicare Other | Source: Ambulatory Visit | Attending: Sports Medicine | Admitting: Sports Medicine

## 2019-09-02 DIAGNOSIS — M1712 Unilateral primary osteoarthritis, left knee: Secondary | ICD-10-CM | POA: Insufficient documentation

## 2019-09-02 DIAGNOSIS — G8929 Other chronic pain: Secondary | ICD-10-CM | POA: Insufficient documentation

## 2019-09-02 DIAGNOSIS — M25562 Pain in left knee: Secondary | ICD-10-CM | POA: Diagnosis present

## 2019-10-29 ENCOUNTER — Encounter
Admission: RE | Admit: 2019-10-29 | Discharge: 2019-10-29 | Disposition: A | Discharge status: Home / Self Care | Payer: Medicare Other | Source: Ambulatory Visit | Attending: Orthopedic Surgery | Admitting: Orthopedic Surgery

## 2019-10-29 ENCOUNTER — Other Ambulatory Visit: Payer: Self-pay

## 2019-10-29 DIAGNOSIS — Z01818 Encounter for other preprocedural examination: Secondary | ICD-10-CM | POA: Diagnosis present

## 2019-10-29 NOTE — H&P (Signed)
ORTHOPAEDIC HISTORY & PHYSICAL Progress Notes by Lamar Benes., MD at 10/12/2019 2:00 PM   Chief Complaint:     Chief Complaint  Patient presents with  . return visit    Left knee pain, Discuss possible surgery    Reason for Visit: The patient is a 85 y.o. female who presents today for reevaluation of her left knee. She reports a 7 month(s) history of left knee pain.  She apparently fell over her couch, causing her to fall backward.  She localizes most of the pain along the medial aspect of the knee. She reports some swelling, no locking, and significant "catching" and giving way of the knee. The pain is aggravated by lateral movements, pivoting, rising after sitting and squatting. The patient has not appreciated any significant improvement despite Tylenol, NSAIDs, topical NSAIDs, intra-articular corticosteroid injection, viscosupplementation, and activity modification.  She is not currently using any ambulatory aids.  Medications: Current Medications        Current Outpatient Medications  Medication Sig Dispense Refill  . acetaminophen (TYLENOL) 650 MG ER tablet Take 650 mg by mouth every 8 (eight) hours as needed for Pain.    . busPIRone (BUSPAR) 15 MG tablet Take 1 tablet (15 mg total) by mouth 2 (two) times daily 60 tablet 11  . estradioL (ESTRACE) 1 MG tablet Take 1 tablet (1 mg total) by mouth once daily 30 tablet 0  . famotidine (PEPCID) 20 MG tablet Take 20 mg by mouth 2 (two) times daily    . multivitamin capsule Take 1 capsule by mouth once daily.    . sertraline (ZOLOFT) 100 MG tablet Take 1 tablet (100 mg total) by mouth once daily 30 tablet 0  . ascorbic acid (VITAMIN C) 500 MG tablet Take 500 mg by mouth once daily.    Marland Kitchen aspirin 325 MG EC tablet Take 325 mg by mouth once daily as needed    . cholecalciferol (CHOLECALCIFEROL) 1,000 unit tablet Take 1,000 Units by mouth once daily.    . diclofenac (VOLTAREN) 1 % topical gel Apply 2 g topically 4  (four) times daily as needed (pain) (Patient not taking: Reported on 10/12/2019  ) 100 g 3  . naproxen sodium (ALEVE, ANAPROX) 220 MG tablet Take 220 mg by mouth 2 (two) times daily as needed for Pain.     No current facility-administered medications for this visit.       Allergies: No Known Allergies  Past Medical History:     Past Medical History:  Diagnosis Date  . Benign neoplasm of colon   . Cataract cortical, senile   . Colon polyp 07/05/14   hyperplastic  . Colon polyps   . GERD (gastroesophageal reflux disease)   . Osteoarthritis    Cervical and Lumbar Spine, hands  . Pure hypercholesterolemia     Past Surgical History:      Past Surgical History:  Procedure Laterality Date  . ARTHROPLASTY TOTAL KNEE Right    Dr. Marry Guan  . ARTHROSCOPY SHOULDER Right   . CHOLECYSTECTOMY    . COLONOSCOPY  07/13/2009  . COLONOSCOPY  07/05/14   repeat 5 years per mus  . HYSTERECTOMY    . Laparoscopic Polypectomy    . TONSILLECTOMY      Social History: Social History  Social History        Socioeconomic History  . Marital status: Widowed    Spouse name: Not on file  . Number of children: Not on file  . Years of education:  Not on file  . Highest education level: Not on file  Occupational History  . Occupation: Retired  Engineer, production  . Financial resource strain: Not on file  . Food insecurity    Worry: Not on file    Inability: Not on file  . Transportation needs    Medical: Not on file    Non-medical: Not on file  Tobacco Use  . Smoking status: Former Games developer  . Smokeless tobacco: Never Used  Substance and Sexual Activity  . Alcohol use: No  . Drug use: No  . Sexual activity: Defer  Lifestyle  . Physical activity    Days per week: Not on file    Minutes per session: Not on file  . Stress: Not on file  Relationships  . Social Musician on phone: Not on file    Gets together: Not on file     Attends religious service: Not on file    Active member of club or organization: Not on file    Attends meetings of clubs or organizations: Not on file    Relationship status: Not on file  Other Topics Concern  . Not on file  Social History Narrative  . Not on file      Family History:      Family History  Problem Relation Age of Onset  . Osteoporosis (Thinning of bones) Mother   . Cancer Mother   . Cancer Father   . Colon cancer Father   . Colon polyps Neg Hx   . Liver disease Neg Hx   . Rectal cancer Neg Hx   . Ulcers Neg Hx     Review of Systems: A comprehensive 14 point ROS was performed, reviewed, and the pertinent orthopaedic findings are documented in the HPI.  Exam BP 132/70 (BP Location: Left upper arm, Patient Position: Sitting, BP Cuff Size: Adult)   Temp 36.7 C (98 F) (Oral)   Ht 162.6 cm (5' 4.02")   Wt 69.9 kg (154 lb 3.2 oz)   BMI 26.46 kg/m   General:  Well-developed, well-nourished female seen in no acute distress.  Antalgic gait.  No varus or valgus thrust to the left knee.  HEENT:  Atraumatic, normocephalic.  Pupils are equal and reactive to light.  Extraocular motion is intact. Sclera are clear.  Oropharynx is clear with moist mucosa.  Lungs:  Clear to auscultation bilaterally.  Cardiovascular: Regular rate and rhythm.  Normal S1, S2.  No murmur .  No appreciable gallops or rubs. Peripheral pulses are palpable.  No lower extremity edema.  Homan`s test is negative.   Extremities: Good strength, stability, and range of motion of the upper extremities. Good range of motion of the hips and ankles.  Left Knee:          Soft tissue swelling: moderate    Effusion:                   minimal    Erythema:                 none    Crepitance:               mild    Tenderness:             medial    Alignment:                normal    Mediolateral laxity:   stable    Anterior drawer test:negative  Lachman`s test:        negative    McMurray`s test:      positive    Atrophy:                    No significant atrophy.                                       Quadriceps tone was fair to good.    Range of Motion:     Greater than 110 degrees  Neurologic:  Awake, alert, and oriented.  Sensory function is intact to pinprick and light touch.   Motor strength is judged to be 5/5.   Motor coordination is within normal limits.   No apparent clonus. No tremor.    Radiographs:  I ordered and interpreted standing AP, lateral, and sunrise radiographs of the left knee that were obtained in the office today. There is narrowing of the medial cartilage space.  Early osteophyte formation is noted. No evidence of fracture or dislocation.     MRI: I reviewed the left knee MRI [facility] dated 09/02/2019.  I concur with the radiologist's interpretation as below:  MRI OF THE LEFT KNEE WITHOUT CONTRAST  TECHNIQUE: Multiplanar, multisequence MR imaging of the knee was performed. No intravenous contrast was administered.  COMPARISON: None.  FINDINGS: MENISCI  Medial meniscus: There is extensive degeneration of the midbody of the medial meniscus with irregularity of the undersurface of the posterior horn without a definitive tear.  Lateral meniscus: There is severe degeneration of the midbody and anterior horn. No definitive tear. There is a large complex parameniscal cyst adjacent to the midbody extending through the lateral patellofemoral ligament measuring 17 x 18 x 10 mm.  LIGAMENTS  Cruciates: Normal.  Collaterals: Intact. There is edema superficial and deep to the MCL which I suspect is due to the underlying medial compartment disease.  CARTILAGE  Patellofemoral: Tiny focal area of full-thickness cartilage loss of the apex of the patella.  Medial: Diffuse thinning of the articular cartilage with full-thickness cartilage loss on the posterior aspect of the femoral condyle  on the periphery of the condyle with secondary subcortical cysts and edema.  Lateral: Slight diffuse thinning of the articular cartilage.  Joint: Small joint effusion. Normal Hoffa's fat pad. No plical thickening.  Popliteal Fossa: No Baker's cyst. Intact popliteus tendon.  Extensor Mechanism: Normal.  Bones: The extra-articular bones are normal.  Other: None  IMPRESSION: 1. Severe degeneration of the midbody and anterior horn of the lateral meniscus with a large complex parameniscal cyst adjacent to the midbody of the lateral meniscus. 2. Osteoarthritis of the medial compartment with full-thickness cartilage loss on the posterior aspect of the medial femoral condyle with severe degeneration of the midbody of the medial meniscus. 3. Small joint effusion.  Electronically Signed  By: Francene BoyersJames Maxwell M.D.  On: 09/03/2019 09:40   Impression: Internal derangement of the left knee  Plan:   The findings were discussed in detail with the patient. The patient was given informational material on knee arthroscopy. Conservative treatment options were reviewed with the patient.  We discussed the risks and benefits of surgical intervention.  The usual perioperative course was also discussed in detail. Arthroscopy is an appropriate treatment for the meniscal pathology, but would have limited or no effect on degenerative changes of the articular cartilage. The patient expressed understanding of the  risks and benefits of surgical intervention and would like to proceed with plans for left knee arthroscopy.  MEDICAL CLEARANCE: Per anesthesiology. ACTIVITIES:  Avoid pivoting, squatting, or twisting. WORK STATUS: Not applicable. THERAPY: Quadriceps strengthening exercises. MEDICATIONS: Requested Prescriptions    No prescriptions requested or ordered in this encounter   FOLLOW-UP: Return for preop History & Physical pending surgery date.     Dublin Cantero P. Angie Fava.,  M.D.  This note was generated in part with voice recognition software and I apologize for any typographical errors that were not detected and corrected.     Electronically signed by Shari Heritage., MD on 10/16/2019 1:53 PM

## 2019-10-29 NOTE — Patient Instructions (Signed)
INSTRUCTIONS FOR SURGERY     Your surgery is scheduled for:   Wednesday, December 16TH     To find out your arrival time for the day of surgery,          please call [phone] between 1 pm and 3 pm on :  Tuesday, December 15TH     When you arrive for surgery, report to the SECOND FLOOR OF THE MEDICAL MALL.       Do NOT stop on the first floor to register.    REMEMBER: Instructions that are not followed completely may result in serious medical risk,  up to and including death, or upon the discretion of your surgeon and anesthesiologist,            your surgery may need to be rescheduled.  __X__ 1. Do not eat food after midnight the night before your procedure.                    No gum, candy, lozenger, tic tacs, tums or hard candies.                  ABSOLUTELY NOTHING SOLID IN YOUR MOUTH AFTER MIDNIGHT                    You may drink unlimited clear liquids up to 2 hours before you are scheduled to arrive for surgery.                   Do not drink anything within those 2 hours unless you need to take medicine, then take the                   smallest amount you need.  Clear liquids include:  water, apple juice without pulp,                   any flavor Gatorade, Black coffee, black tea.  Sugar may be added but no dairy/ honey /lemon.                        Broth and jello is not considered a clear liquid.  __x__  2. On the morning of surgery, please brush your teeth with toothpaste and water. You may rinse with                  mouthwash if you wish but DO NOT SWALLOW TOOTHPASTE OR MOUTHWASH  __X___3. NO alcohol for 24 hours before or after surgery.  __x___ 4.  Do NOT smoke or use e-cigarettes for 24 HOURS PRIOR TO SURGERY.                      DO NOT Use any chewable tobacco products for at least 6 hours prior to surgery.  __x___ 5. If you start any new medication after this appointment and prior to surgery, please             Bring it with you on the day of surgery.  ___x__ 6. Notify your doctor if there is any change in your medical condition, such as fever, infection,  vomitting,                   Diarrhea or any open sores.  __x___ 7.  USE the CHG SOAP as instructed, the night before surgery and the day of surgery.                   Once you have washed with this soap, do NOT use any of the following: Powders, perfumes                    or lotions. Please do not wear make up, hairpins, clips or nail polish. You MAY wear deodorant.                   Men may shave their face and neck.  Women need to shave 48 hours prior to surgery.                   DO NOT wear ANY jewelry on the day of surgery. If there are rings that are too tight to                    remove easily, please address this prior to the surgery day. Piercings need to be removed.                                                                     NO METAL ON YOUR BODY.                    Do NOT bring any valuables.  If you came to Pre-Admit testing then you will not need license,                     insurance card or credit card.  If you will be staying overnight, please either leave your things in                     the car or have your family be responsible for these items.                     [facility] IS NOT RESPONSIBLE FOR BELONGINGS OR VALUABLES.  ___X__ 8. DO NOT wear contact lenses on surgery day.  You may not have dentures,                     Hearing aides, contacts or glasses in the operating room. These items can be                    Placed in the Recovery Room to receive immediately after surgery.  __x___ 9. IF YOU ARE SCHEDULED TO GO HOME ON THE SAME DAY, YOU MUST                   Have someone to drive you home and to stay with you  for the first 24 hours.                    Have an arrangement prior to arriving on surgery day.  ___x__ 10. Take the following medications on the morning of surgery with a sip  of water:                               1. ESTRACE                     2. PEPCID                     3. ZOLOFT                     4.                     5.                     6.  _____ 11.  Follow any instructions provided to you by your surgeon.                        Such as enema, clear liquid bowel prep  __X__  12. STOP  ASPIRIN AS OF: December 7TH                       THIS INCLUDES BC POWDERS / GOODIES POWDER  __x___ 13. STOP Anti-inflammatories as of: December 7TH                      This includes IBUPROFEN / MOTRIN / ADVIL / ALEVE/ NAPROXYN                    YOU MAY TAKE TYLENOL ANY TIME PRIOR TO SURGERY.  _____ 70.  Stop supplements until after surgery.                     This includes: N/A                 You may continue taking Vitamin B12 / Vitamin D3 / Multivitamins,  but do not take on the morning of surgery.  ______17.  Continue to take the following medications but do not take on the morning of surgery:                          n/a  ______18. If staying overnight, please have appropriate shoes to wear to be able to walk around the unit.                   Wear clean and comfortable clothing to the hospital.  Wear loose fitting pants that will be easy to get a bandaged knee inside.

## 2019-10-29 NOTE — Discharge Instructions (Signed)
°  Instructions after Knee Arthroscopy  ° ° Abass Misener P. Lavanya Roa, Jr., M.D.    ° Dept. of Orthopaedics & Sports Medicine ° [facility] ° [address] ° [place], [place]  [address] ° ° Phone: [phone]   Fax: [phone] ° ° °DIET: °• Drink plenty of non-alcoholic fluids & begin a light diet. °• Resume your normal diet the day after surgery. ° °ACTIVITY:  °• You may use crutches or a walker with weight-bearing as tolerated, unless instructed otherwise. °• You may wean yourself off of the walker or crutches as tolerated.  °• Begin doing gentle exercises. Exercising will reduce the pain and swelling, increase motion, and prevent muscle weakness.   °• Avoid strenuous activities or athletics for a minimum of 4-6 weeks after arthroscopic surgery. °• Do not drive or operate any equipment until instructed. ° °WOUND CARE:  °• Place one to two pillows under the knee the first day or two when sitting or lying.  °• Continue to use the ice packs periodically to reduce pain and swelling. °• The small incisions in your knee are closed with nylon stitches. The stitches will be removed in the office. °• The bulky dressing may be removed on the second day after surgery. DO NOT TOUCH THE STITCHES. Put a Band-Aid over each stitch. Do NOT use any ointments or creams on the incisions.  °• You may bathe or shower after the stitches are removed at the first office visit following surgery. ° °MEDICATIONS: °• You may resume your regular medications. °• Please take the pain medication as prescribed. °• Do not take pain medication on an empty stomach. °• Do not drive or drink alcoholic beverages when taking pain medications. ° °CALL THE OFFICE FOR: °• Temperature above 101 degrees °• Excessive bleeding or drainage on the dressing. °• Excessive swelling, coldness, or paleness of the toes. °• Persistent nausea and vomiting. ° °FOLLOW-UP:  °• You should have an appointment to return to the office in 7-10 days after surgery.  °  °

## 2019-11-05 ENCOUNTER — Other Ambulatory Visit
Admission: RE | Admit: 2019-11-05 | Discharge: 2019-11-05 | Disposition: A | Discharge status: Home / Self Care | Payer: Medicare Other | Source: Ambulatory Visit | Attending: Orthopedic Surgery | Admitting: Orthopedic Surgery

## 2019-11-05 DIAGNOSIS — Z20828 Contact with and (suspected) exposure to other viral communicable diseases: Secondary | ICD-10-CM | POA: Diagnosis not present

## 2019-11-05 DIAGNOSIS — Z01812 Encounter for preprocedural laboratory examination: Secondary | ICD-10-CM | POA: Diagnosis present

## 2019-11-05 LAB — SARS CORONAVIRUS 2 (TAT 6-24 HRS): SARS Coronavirus 2: NEGATIVE

## 2019-11-08 ENCOUNTER — Encounter (HOSPITAL_COMMUNITY): Payer: Self-pay | Admitting: Certified Registered Nurse Anesthetist

## 2019-11-10 ENCOUNTER — Ambulatory Visit: Admission: RE | Admit: 2019-11-10 | Payer: Medicare Other | Source: Home / Self Care | Admitting: Orthopedic Surgery

## 2019-11-10 ENCOUNTER — Encounter: Admission: RE | Payer: Self-pay | Source: Home / Self Care

## 2019-11-10 SURGERY — ARTHROSCOPY, KNEE
Anesthesia: Choice | Site: Knee | Laterality: Left

## 2020-07-21 IMAGING — MR MRI HEAD WITHOUT CONTRAST
14 of 16 series · 39 of 48 positions shown · non-contrast
Comparison: MRI cervical spine 07/09/2011.

CLINICAL DATA: Patient fell 3 weeks ago. Now has head and neck
pain. Fall with injury, initial encounter.

EXAM:
MRI HEAD WITHOUT CONTRAST
MRI CERVICAL SPINE WITHOUT CONTRAST
TECHNIQUE: Multiplanar, multiecho pulse sequences of the brain and surrounding
structures, and cervical spine, to include the craniocervical
junction and cervicothoracic junction, were obtained without
intravenous contrast.

[Series 5: ax dwi_tracew · axial · 3.0mm · 0.60mm/px · z∈[-27,+125]mm · 3 of 52 slices shown]
[im 1/52]
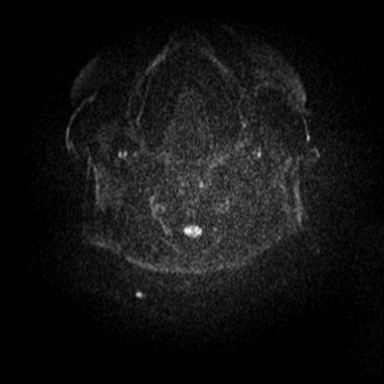
[im 26/52]
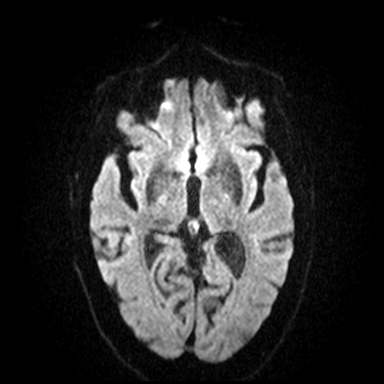
[im 52/52]
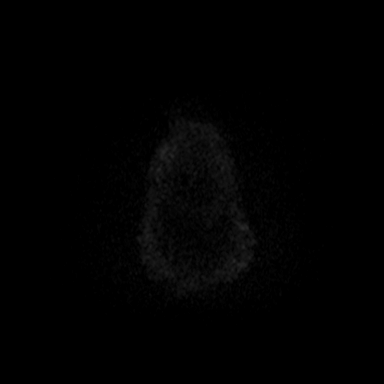

[Series 6: ax dwi_adc · axial · 3.0mm · 0.60mm/px · z∈[-27,+125]mm · 4 of 52 slices shown]
[im 1/52]
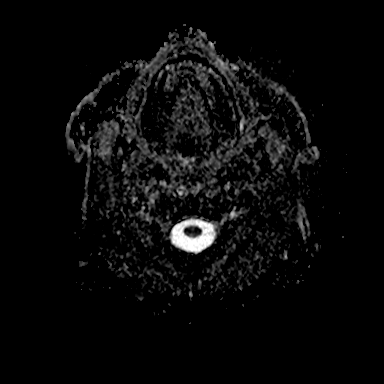
[im 18/52]
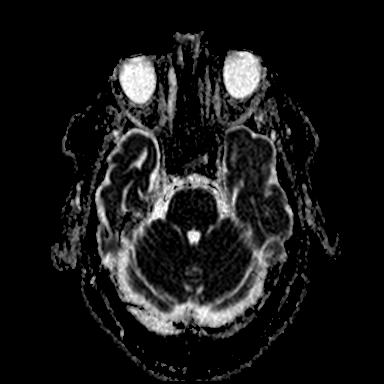
[im 35/52]
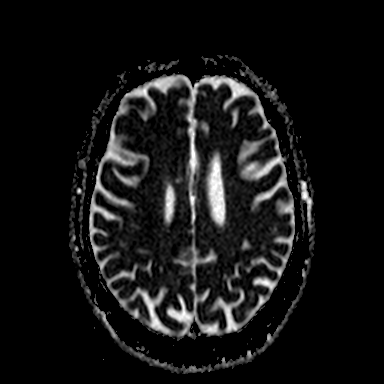
[im 52/52]
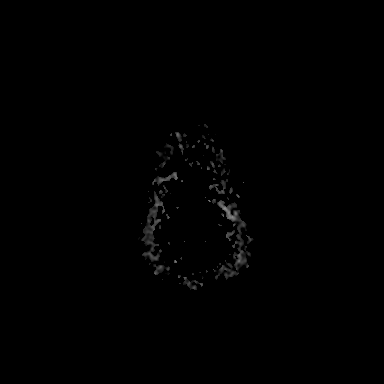

[Series 7: cor dwi_tracew · coronal · 5.0mm · 0.60mm/px · 3 of 39 slices shown]
[im 1/39]
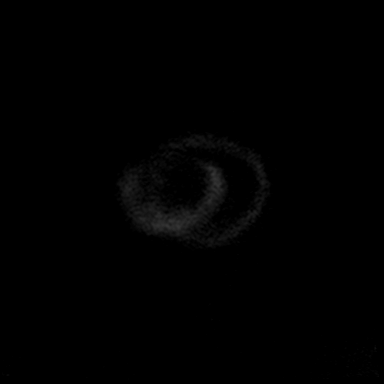
[im 20/39]
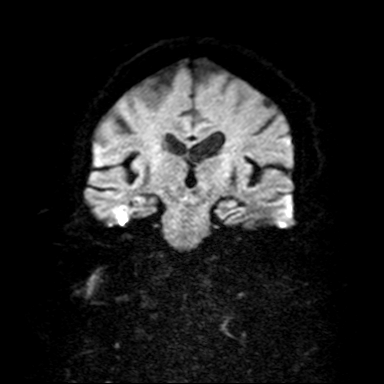
[im 39/39]
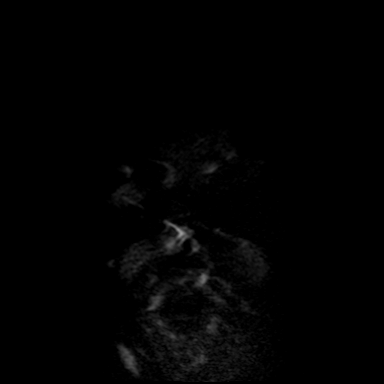

[Series 8: cor dwi_adc · coronal · 5.0mm · 0.60mm/px · 3 of 39 slices shown]
[im 1/39]
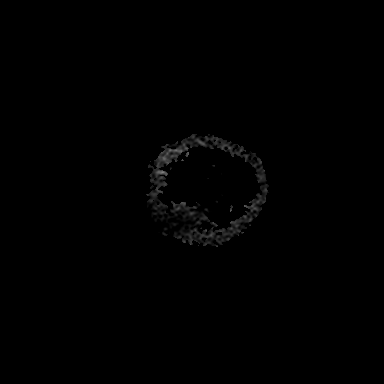
[im 20/39]
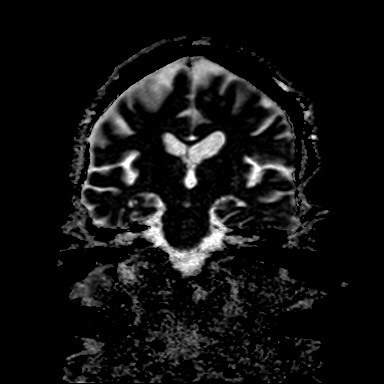
[im 39/39]
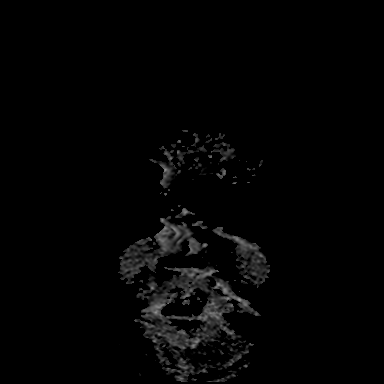

[Series 9: T1 · sagittal · 5.0mm · 0.62mm/px · 1 of 21 slices shown (1 of 2)]
[im 1/21]
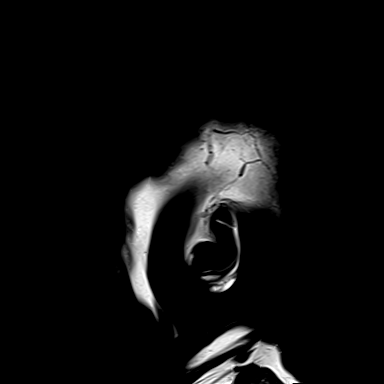

[Series 10: T2 · axial · 5.0mm · 0.55mm/px · z∈[-23,+120]mm · 2 of 25 slices shown (1 of 4)]
[im 1/25]
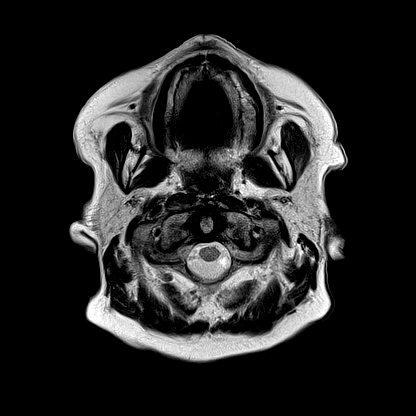
[im 25/25]
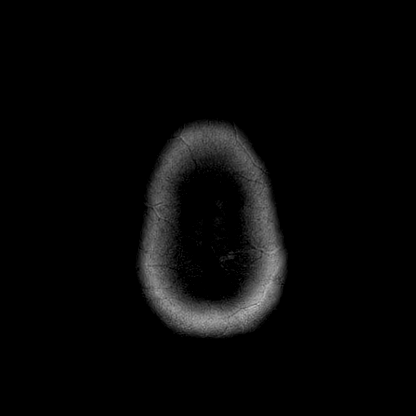

[Series 12: pha_images · axial · 3.0mm · 0.90mm/px · z∈[-40,+136]mm · 4 of 60 slices shown]
[im 1/60]
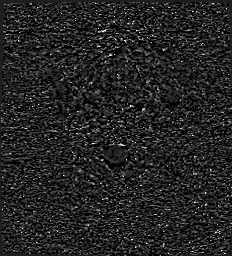
[im 20/60]
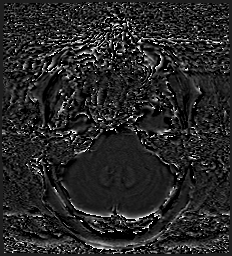
[im 40/60]
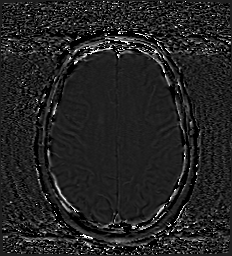
[im 60/60]
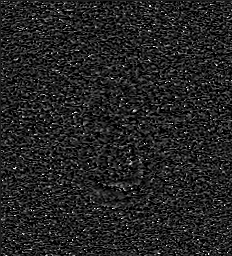

[Series 15: FLAIR · axial · 3.0mm · 0.55mm/px · z∈[-32,+129]mm · 4 of 55 slices shown (1 of 2)]
[im 1/55]
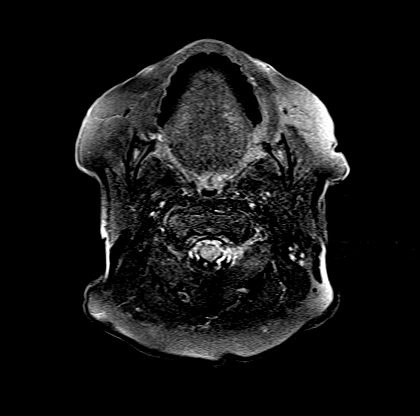
[im 19/55]
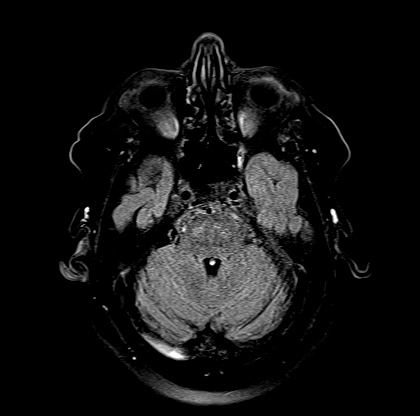
[im 37/55]
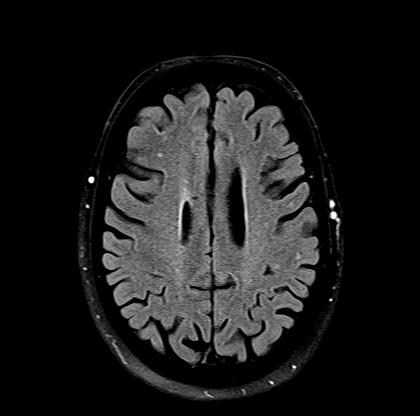
[im 55/55]
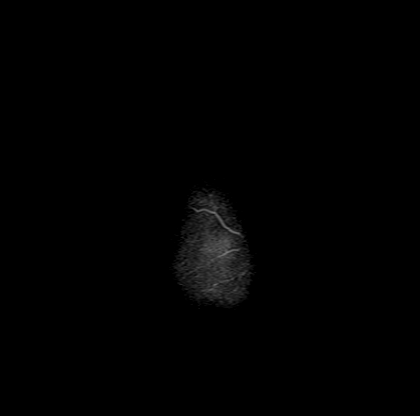

[Series 16: T1 · axial · 1.0mm · 0.98mm/px · z∈[-29,+129]mm · 8 of 160 slices shown (2 of 2)]
[im 1/160]
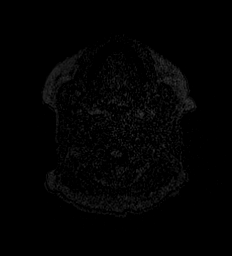
[im 32/160]
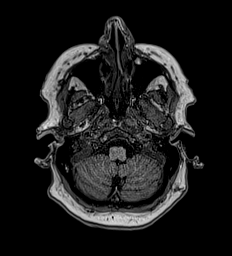
[im 48/160]
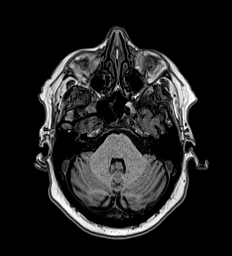
[im 64/160]
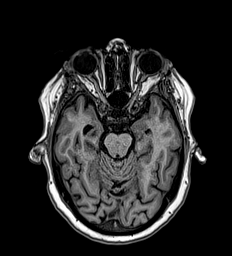
[im 96/160]
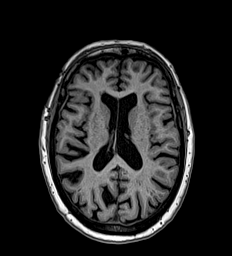
[im 112/160]
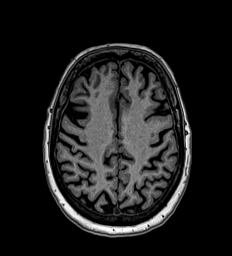
[im 128/160]
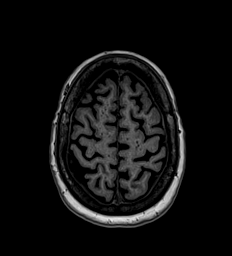
[im 160/160]
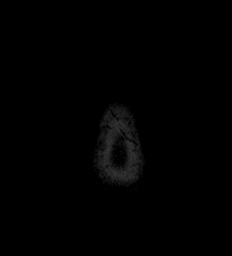

[Series 17: T2 · coronal · 5.0mm · 0.57mm/px · 2 of 29 slices shown (2 of 4)]
[im 1/29]
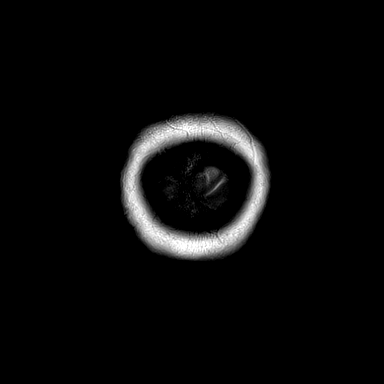
[im 29/29]
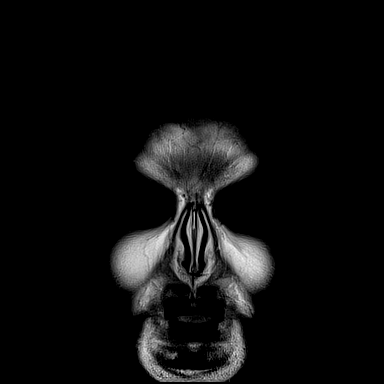

[Series 22: T2 · sagittal · 3.0mm · 0.62mm/px · 1 of 13 slices shown (3 of 4)]
[im 1/13]
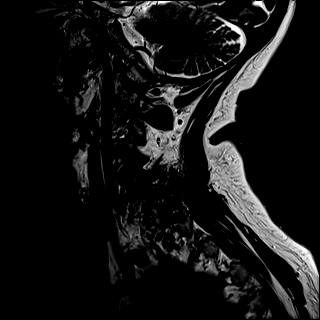

[Series 23: FLAIR · sagittal · 3.0mm · 0.78mm/px · 1 of 13 slices shown (2 of 2)]
[im 1/13]
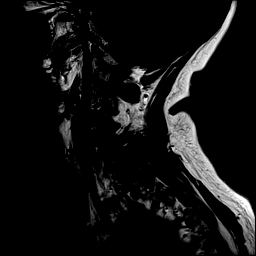

[Series 24: STIR · sagittal · 3.0mm · 0.62mm/px · 1 of 13 slices shown]
[im 1/13]
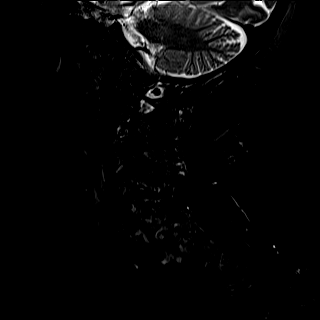

[Series 25: T2 · axial · 3.0mm · 0.70mm/px · z∈[-172,-77]mm · 2 of 29 slices shown (4 of 4)]
[im 1/29]
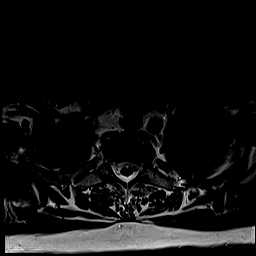
[im 29/29]
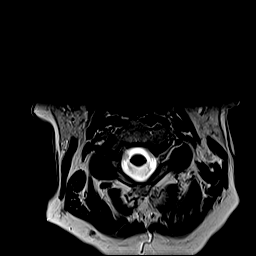

[39 of 48 positions shown; findings below may reference images not displayed]

FINDINGS: MRI HEAD FINDINGS

Brain: No evidence for acute infarction, hemorrhage, mass lesion,
hydrocephalus, or extra-axial fluid. Normal for age cerebral volume.
Mild subcortical and periventricular T2 and FLAIR hyperintensities,
likely chronic microvascular ischemic change.

Careful examination of the extracerebral CSF spaces reveals no
occult subdural or subarachnoid blood.

Vascular: Normal flow voids.

Skull and upper cervical spine: Unremarkable skull base and
calvarium. No visible scalp hematoma.

Sinuses/Orbits: BILATERAL cataract extraction. No significant sinus
disease.

Other: No significant mastoid fluid.  Unremarkable nasopharynx.

MRI CERVICAL SPINE FINDINGS

Alignment: Degenerative/facet mediated anterolisthesis at C4-5 of 1
mm, C5-6 of 2 mm, C6-7 of 3 mm, and C7-T1 of 2 mm. No evidence of
traumatic subluxation.

Vertebrae: No fracture, evidence of discitis, or bone lesion.

Cord: Mild cord compression at C5-6, multifactorial. No abnormal
cord signal.

Posterior Fossa, vertebral arteries, paraspinal tissues:
Unremarkable cerebellar tonsils. BILATERAL vertebral flow voids are
maintained. No neck masses.

Disc levels:

C2-3:  Unremarkable.

C3-4: BILATERAL facet arthropathy, much greater on the LEFT.
Significant LEFT C4 foraminal narrowing.

C4-5: 1 mm anterolisthesis. Annular bulge. Significant LEFT facet
arthropathy. LEFT C5 foraminal narrowing.

C5-6: 2 mm anterolisthesis. BILATERAL facet arthropathy. Annular
bulge. BILATERAL C6 foraminal narrowing.

C6-7: 3 mm anterolisthesis. Uncovering of the disc with central
protrusion. Slight caudally migrated component. Facet arthropathy.
BILATERAL C7 foraminal narrowing, RIGHT greater than LEFT. Mild cord
flattening without abnormal cord signal.

C7-T1:  Trace anterolisthesis is facet mediated.  No impingement.

Compared with priors, there is slight progression of facet
arthropathy and degenerative anterolisthesis. No central protrusion
was noted at C6-7 in 1441.
IMPRESSION: MRI brain demonstrates normal changes of senescence. No acute
findings. No evidence of occult craniocerebral trauma.

MRI cervical spine demonstrates no acute fracture or traumatic
subluxation. Multilevel spondylosis is predominantly facet mediated,
most pronounced at C6-7 where there is 3 mm slip along with
superimposed central protrusion; this is of doubtful acuity, given
the similar appearance in 1441, although BILATERAL C7 foraminal
narrowing could be symptomatic.

## 2021-02-16 ENCOUNTER — Emergency Department: Payer: Medicare Other

## 2021-02-16 ENCOUNTER — Emergency Department
Admission: EM | Admit: 2021-02-16 | Discharge: 2021-02-17 | Disposition: A | Discharge status: Home / Self Care | Payer: Medicare Other | Attending: Emergency Medicine | Admitting: Emergency Medicine

## 2021-02-16 ENCOUNTER — Other Ambulatory Visit: Payer: Self-pay

## 2021-02-16 DIAGNOSIS — Z87891 Personal history of nicotine dependence: Secondary | ICD-10-CM | POA: Insufficient documentation

## 2021-02-16 DIAGNOSIS — S2231XA Fracture of one rib, right side, initial encounter for closed fracture: Secondary | ICD-10-CM | POA: Diagnosis not present

## 2021-02-16 DIAGNOSIS — W19XXXA Unspecified fall, initial encounter: Secondary | ICD-10-CM

## 2021-02-16 DIAGNOSIS — W01198A Fall on same level from slipping, tripping and stumbling with subsequent striking against other object, initial encounter: Secondary | ICD-10-CM | POA: Insufficient documentation

## 2021-02-16 DIAGNOSIS — Y92010 Kitchen of single-family (private) house as the place of occurrence of the external cause: Secondary | ICD-10-CM | POA: Diagnosis not present

## 2021-02-16 DIAGNOSIS — R0789 Other chest pain: Secondary | ICD-10-CM

## 2021-02-16 DIAGNOSIS — I6782 Cerebral ischemia: Secondary | ICD-10-CM | POA: Insufficient documentation

## 2021-02-16 DIAGNOSIS — S299XXA Unspecified injury of thorax, initial encounter: Secondary | ICD-10-CM | POA: Diagnosis present

## 2021-02-16 LAB — CBC
HCT: 37.9 % (ref 36.0–46.0)
Hemoglobin: 12.5 g/dL (ref 12.0–15.0)
MCH: 31.2 pg (ref 26.0–34.0)
MCHC: 33 g/dL (ref 30.0–36.0)
MCV: 94.5 fL (ref 80.0–100.0)
Platelets: 295 10*3/uL (ref 150–400)
RBC: 4.01 MIL/uL (ref 3.87–5.11)
RDW: 12.8 % (ref 11.5–15.5)
WBC: 12.7 10*3/uL — ABNORMAL HIGH (ref 4.0–10.5)
nRBC: 0.2 % (ref 0.0–0.2)

## 2021-02-16 LAB — BASIC METABOLIC PANEL
Anion gap: 9 (ref 5–15)
BUN: 15 mg/dL (ref 8–23)
CO2: 25 mmol/L (ref 22–32)
Calcium: 9.2 mg/dL (ref 8.9–10.3)
Chloride: 105 mmol/L (ref 98–111)
Creatinine, Ser: 1.05 mg/dL — ABNORMAL HIGH (ref 0.44–1.00)
GFR, Estimated: 52 mL/min — ABNORMAL LOW (ref >60)
Glucose, Bld: 116 mg/dL — ABNORMAL HIGH (ref 70–99)
Potassium: 4.1 mmol/L (ref 3.5–5.1)
Sodium: 139 mmol/L (ref 135–145)

## 2021-02-16 MED ORDER — FENTANYL CITRATE (PF) 100 MCG/2ML IJ SOLN
75.0000 ug | Freq: Once | INTRAMUSCULAR | Status: AC
  Administered 2021-02-16: 75 ug via INTRAVENOUS
  Filled 2021-02-16: qty 2

## 2021-02-16 MED ORDER — OXYCODONE-ACETAMINOPHEN 5-325 MG PO TABS: 1.0000 | ORAL_TABLET | ORAL | 0 refills | Status: DC | PRN

## 2021-02-16 MED ORDER — ONDANSETRON HCL 4 MG/2ML IJ SOLN
4.0000 mg | Freq: Once | INTRAMUSCULAR | Status: AC
  Administered 2021-02-16: 4 mg via INTRAVENOUS
  Filled 2021-02-16: qty 2

## 2021-02-16 MED ORDER — SODIUM CHLORIDE 0.9 % IV BOLUS
1000.0000 mL | Freq: Once | INTRAVENOUS | Status: AC
  Administered 2021-02-16: 1000 mL via INTRAVENOUS

## 2021-02-16 NOTE — Discharge Instructions (Signed)
Please take your pain medication as needed, as prescribed.  Please use your incentive spirometer several times per hour while awake for the next 7 days.  Please follow-up with your doctor in the next 3 days for recheck/reevaluation.  Return to the emergency department for any increased pain, trouble breathing, or any other symptom personally concerning to yourself.

## 2021-02-16 NOTE — ED Provider Notes (Signed)
[facility] Emergency Department Provider Note  Time seen: 10:40 PM  I have reviewed the triage vital signs and the nursing notes.   HISTORY  Chief Complaint Fall   HPI Melissa CriglerFrances K Anthony is a 87 y.o. female with a past medical of anxiety, arthritis, gastric reflux, presents to the emergency department after a fall with right-sided rib pain.  According to the patient she stood up from her chair in the kitchen and was going to walk for the counter when she lost her balance falling forwards hitting her right chest on the counter and then falling to the ground.  Denies hitting her head.  Denies LOC.  Denies anticoagulation.  No headache.  Patient's only complaint is moderate pain to her right lateral chest wall.  Patient has been ambulatory since the event but does state pain with walking in the right lateral chest wall.   Past Medical History:  Diagnosis Date  . Anxiety   . Arthritis   . Depression   . GERD (gastroesophageal reflux disease)     Patient Active Problem List   Diagnosis Date Noted  . CAP (community acquired pneumonia) 03/24/2018    Past Surgical History:  Procedure Laterality Date  . 23 GAUGE PARS PLANA VITRECTOMY WITH 23 GAUGE MVR PORT    . 23 GAUGE PARS PLANA VITRECTOMY WITH 23 GAUGE MVR PORT    . 23 GAUGE PARS PLANA VITRECTOMY WITH 23 GAUGE MVR PORT    . 23 GAUGE PARS PLANA VITRECTOMY WITH 23 GAUGE MVR PORT    . 23 GAUGE PARS PLANA VITRECTOMY WITH 23 GAUGE MVR PORT    . 23 GAUGE PARS PLANA VITRECTOMY WITH 23 GAUGE MVR PORT    . 23 GAUGE PARS PLANA VITRECTOMY WITH 23 GAUGE MVR PORT    . 23 GAUGE PARS PLANA VITRECTOMY WITH 23 GAUGE MVR PORT    . 23 GAUGE PARS PLANA VITRECTOMY WITH 23 GAUGE MVR PORT    . 23 GAUGE PARS PLANA VITRECTOMY WITH 23 GAUGE MVR PORT    . 23 GAUGE PARS PLANA VITRECTOMY WITH 23 GAUGE MVR PORT    . 23 GAUGE PARS PLANA VITRECTOMY WITH 23 GAUGE MVR PORT    . 23 GAUGE PARS PLANA VITRECTOMY WITH 23 GAUGE MVR PORT    . 23  GAUGE PARS PLANA VITRECTOMY WITH 23 GAUGE MVR PORT    . 23 GAUGE PARS PLANA VITRECTOMY WITH 23 GAUGE MVR PORT    . 23 GAUGE PARS PLANA VITRECTOMY WITH 23 GAUGE MVR PORT    . 23 GAUGE PARS PLANA VITRECTOMY WITH 23 GAUGE MVR PORT    . 23 GAUGE PARS PLANA VITRECTOMY WITH 23 GAUGE MVR PORT    . 23 GAUGE PARS PLANA VITRECTOMY WITH 23 GAUGE MVR PORT    . 23 GAUGE PARS PLANA VITRECTOMY WITH 23 GAUGE MVR PORT    . 23 GAUGE PARS PLANA VITRECTOMY WITH 23 GAUGE MVR PORT    . 23 GAUGE PARS PLANA VITRECTOMY WITH 23 GAUGE MVR PORT    . 23 GAUGE PARS PLANA VITRECTOMY WITH 23 GAUGE MVR PORT    . 23 GAUGE PARS PLANA VITRECTOMY WITH 23 GAUGE MVR PORT    . 23 GAUGE PARS PLANA VITRECTOMY WITH 23 GAUGE MVR PORT    . 23 GAUGE PARS PLANA VITRECTOMY WITH 23 GAUGE MVR PORT    . 23 GAUGE PARS PLANA VITRECTOMY WITH 23 GAUGE MVR PORT    . 23 GAUGE PARS PLANA VITRECTOMY WITH 23 GAUGE MVR PORT    .  23 GAUGE PARS PLANA VITRECTOMY WITH 23 GAUGE MVR PORT    . 23 GAUGE PARS PLANA VITRECTOMY WITH 23 GAUGE MVR PORT    . 23 GAUGE PARS PLANA VITRECTOMY WITH 23 GAUGE MVR PORT    . 23 GAUGE PARS PLANA VITRECTOMY WITH 23 GAUGE MVR PORT    . 23 GAUGE PARS PLANA VITRECTOMY WITH 23 GAUGE MVR PORT    . 23 GAUGE PARS PLANA VITRECTOMY WITH 23 GAUGE MVR PORT    . 23 GAUGE PARS PLANA VITRECTOMY WITH 23 GAUGE MVR PORT    . 23 GAUGE PARS PLANA VITRECTOMY WITH 23 GAUGE MVR PORT    . 23 GAUGE PARS PLANA VITRECTOMY WITH 23 GAUGE MVR PORT    . 23 GAUGE PARS PLANA VITRECTOMY WITH 23 GAUGE MVR PORT    . 23 GAUGE PARS PLANA VITRECTOMY WITH 23 GAUGE MVR PORT    . 23 GAUGE PARS PLANA VITRECTOMY WITH 23 GAUGE MVR PORT    . 23 GAUGE PARS PLANA VITRECTOMY WITH 23 GAUGE MVR PORT    . 23 GAUGE PARS PLANA VITRECTOMY WITH 23 GAUGE MVR PORT    . 23 GAUGE PARS PLANA VITRECTOMY WITH 23 GAUGE MVR PORT    . 23 GAUGE PARS PLANA VITRECTOMY WITH 23 GAUGE MVR PORT    . 23 GAUGE PARS PLANA VITRECTOMY WITH 23 GAUGE MVR PORT    . 23 GAUGE PARS PLANA VITRECTOMY  WITH 23 GAUGE MVR PORT    . 23 GAUGE PARS PLANA VITRECTOMY WITH 23 GAUGE MVR PORT    . 23 GAUGE PARS PLANA VITRECTOMY WITH 23 GAUGE MVR PORT    . 23 GAUGE PARS PLANA VITRECTOMY WITH 23 GAUGE MVR PORT    . 23 GAUGE PARS PLANA VITRECTOMY WITH 23 GAUGE MVR PORT    . 23 GAUGE PARS PLANA VITRECTOMY WITH 23 GAUGE MVR PORT    . 23 GAUGE PARS PLANA VITRECTOMY WITH 23 GAUGE MVR PORT    . 23 GAUGE PARS PLANA VITRECTOMY WITH 23 GAUGE MVR PORT    . 23 GAUGE PARS PLANA VITRECTOMY WITH 23 GAUGE MVR PORT    . 23 GAUGE PARS PLANA VITRECTOMY WITH 23 GAUGE MVR PORT    . 23 GAUGE PARS PLANA VITRECTOMY WITH 23 GAUGE MVR PORT    . 23 GAUGE PARS PLANA VITRECTOMY WITH 23 GAUGE MVR PORT    . 23 GAUGE PARS PLANA VITRECTOMY WITH 23 GAUGE MVR PORT    . 23 GAUGE PARS PLANA VITRECTOMY WITH 23 GAUGE MVR PORT    . 23 GAUGE PARS PLANA VITRECTOMY WITH 23 GAUGE MVR PORT    . 23 GAUGE PARS PLANA VITRECTOMY WITH 23 GAUGE MVR PORT    . 23 GAUGE PARS PLANA VITRECTOMY WITH 23 GAUGE MVR PORT    . 23 GAUGE PARS PLANA VITRECTOMY WITH 23 GAUGE MVR PORT    . 23 GAUGE PARS PLANA VITRECTOMY WITH 23 GAUGE MVR PORT    . 23 GAUGE PARS PLANA VITRECTOMY WITH 23 GAUGE MVR PORT    . 23 GAUGE PARS PLANA VITRECTOMY WITH 23 GAUGE MVR PORT    . 23 GAUGE PARS PLANA VITRECTOMY WITH 23 GAUGE MVR PORT    . 23 GAUGE PARS PLANA VITRECTOMY WITH 23 GAUGE MVR PORT    . 23 GAUGE PARS PLANA VITRECTOMY WITH 23 GAUGE MVR PORT    . 23 GAUGE PARS PLANA VITRECTOMY WITH 23 GAUGE MVR PORT    . 23 GAUGE PARS PLANA VITRECTOMY WITH 23 GAUGE MVR PORT    .  23 GAUGE PARS PLANA VITRECTOMY WITH 23 GAUGE MVR PORT    . 23 GAUGE PARS PLANA VITRECTOMY WITH 23 GAUGE MVR PORT    . 23 GAUGE PARS PLANA VITRECTOMY WITH 23 GAUGE MVR PORT    . 23 GAUGE PARS PLANA VITRECTOMY WITH 23 GAUGE MVR PORT    . 23 GAUGE PARS PLANA VITRECTOMY WITH 23 GAUGE MVR PORT    . 23 GAUGE PARS PLANA VITRECTOMY WITH 23 GAUGE MVR PORT    . 23 GAUGE PARS PLANA VITRECTOMY WITH 23 GAUGE MVR PORT    .  23 GAUGE PARS PLANA VITRECTOMY WITH 23 GAUGE MVR PORT    . 23 GAUGE PARS PLANA VITRECTOMY WITH 23 GAUGE MVR PORT    . 23 GAUGE PARS PLANA VITRECTOMY WITH 23 GAUGE MVR PORT    . 23 GAUGE PARS PLANA VITRECTOMY WITH 23 GAUGE MVR PORT    . 23 GAUGE PARS PLANA VITRECTOMY WITH 23 GAUGE MVR PORT    . 23 GAUGE PARS PLANA VITRECTOMY WITH 23 GAUGE MVR PORT    . 23 GAUGE PARS PLANA VITRECTOMY WITH 23 GAUGE MVR PORT    . 23 GAUGE PARS PLANA VITRECTOMY WITH 23 GAUGE MVR PORT    . 23 GAUGE PARS PLANA VITRECTOMY WITH 23 GAUGE MVR PORT    . 23 GAUGE PARS PLANA VITRECTOMY WITH 23 GAUGE MVR PORT    . 23 GAUGE PARS PLANA VITRECTOMY WITH 23 GAUGE MVR PORT    . ABDOMINAL HYSTERECTOMY    . APPENDECTOMY    . CATARACT EXTRACTION W/PHACO Left 06/16/2018   Procedure: CATARACT EXTRACTION PHACO AND INTRAOCULAR LENS PLACEMENT (IOC);  Surgeon: Galen Manila, MD;  Location: [facility] ORS;  Service: Ophthalmology;  Laterality: Left;  [place] 00:45.6 AP% 19.7 CDE 8.96 Fluid Pack lot # [id number]  . CHOLECYSTECTOMY    . COLON SURGERY     long time ago. can't remember why or what  . COLONOSCOPY    . EYE SURGERY Bilateral 2020   cataract extractions  . JOINT REPLACEMENT Right 2010   TKR  . TONSILLECTOMY      Prior to Admission medications   Medication Sig Start Date End Date Taking? Authorizing Provider  acetaminophen (TYLENOL) 325 MG tablet Take 650 mg by mouth every 6 (six) hours as needed for moderate pain.    [provider]  busPIRone (BUSPAR) 15 MG tablet Take 15 mg by mouth 2 (two) times daily.     [provider]  estradiol (ESTRACE) 1 MG tablet Take 1 mg by mouth daily.    [provider]  famotidine (PEPCID) 20 MG tablet Take 20 mg by mouth 2 (two) times daily. 08/27/19   [provider]  Multiple Vitamin (MULTIVITAMIN WITH MINERALS) TABS tablet Take 1 tablet by mouth daily.    [provider]  naproxen sodium (ALEVE) 220 MG tablet Take 220-440 mg by mouth daily as needed  (arthritis).     [provider]  sertraline (ZOLOFT) 100 MG tablet Take 100 mg by mouth daily.    [provider]    No Known Allergies  History reviewed. No pertinent family history.  Social History Social History   Tobacco Use  . Smoking status: Former Smoker    Types: Cigarettes    Quit date: 1980    Years since quitting: 42.2  . Smokeless tobacco: Never Used  Vaping Use  . Vaping Use: Never used  Substance Use Topics  . Alcohol use: Never  . Drug use: Never  Review of Systems Constitutional: Negative for fever.  Negative for LOC.  Negative for head injury. Cardiovascular: Positive for right chest wall pain in the right lateral lower chest. Respiratory: Negative for shortness of breath.  Pain with deep inspiration. Gastrointestinal: Negative for abdominal pain Musculoskeletal: Negative for musculoskeletal complaints Skin: Negative for skin complaints  Neurological: Negative for headache All other ROS negative  ____________________________________________   PHYSICAL EXAM:  VITAL SIGNS: ED Triage Vitals  Enc Vitals Group     BP 02/16/21 2150 (!) 152/64     Pulse Rate 02/16/21 2149 76     Resp 02/16/21 2149 18     Temp 02/16/21 2149 97.9 F (36.6 C)     Temp Source 02/16/21 2149 Oral     SpO2 02/16/21 2149 96 %     Weight 02/16/21 2147 165 lb (74.8 kg)     Height 02/16/21 2147 5\' 4"  (1.626 m)     Head Circumference --      Peak Flow --      Pain Score 02/16/21 2147 10     Pain Loc --      Pain Edu? --      Excl. in GC? --     Constitutional: Alert and oriented. Well appearing and in no distress. Eyes: Normal exam ENT      Head: Normocephalic and atraumatic.      Mouth/Throat: Mucous membranes are moist. Cardiovascular: Normal rate, regular rhythm. Respiratory: Normal respiratory effort without tachypnea nor retractions. Breath sounds are clear.  Patient does have moderate right lateral and posterior lower chest wall tenderness to  palpation Gastrointestinal: Soft and nontender. Musculoskeletal: Nontender with normal range of motion in all extremities.  Great range of motion all extremities without pain elicited. Neurologic:  Normal speech and language. No gross focal neurologic deficits Skin:  Skin is warm, dry and intact.  Psychiatric: Mood and affect are normal.  ____________________________________________    RADIOLOGY  X-ray of the ribs are negative for acute abnormality  ____________________________________________   INITIAL IMPRESSION / ASSESSMENT AND PLAN / ED COURSE  Pertinent labs & imaging results that were available during my care of the patient were reviewed by me and considered in my medical decision making (see chart for details).   Patient presents emergency department for fall with moderate right lateral chest wall discomfort after hitting a countertop and fell to the floor.  Patient has been ambulatory since fall.  Able to move all extremities.  Moderate tenderness, chest x-ray negative for acute abnormality.  We will obtain a CT scan of the chest to further evaluate.  We will check basic labs and treat with pain medication.  CT scan shows posterior right-sided rib fracture.  Lab work pending.  We will plan on discharging with incentive spirometry pain medication and PCP follow-up.  Melissa Anthony was evaluated in Emergency Department on 02/16/2021 for the symptoms described in the history of present illness. She was evaluated in the context of the global COVID-19 pandemic, which necessitated consideration that the patient might be at risk for infection with the SARS-CoV-2 virus that causes COVID-19. Institutional protocols and algorithms that pertain to the evaluation of patients at risk for COVID-19 are in a state of rapid change based on information released by regulatory bodies including the CDC and federal and state organizations. These policies and algorithms were followed during the patient's  care in the ED.  ____________________________________________   FINAL CLINICAL IMPRESSION(S) / ED DIAGNOSES  Right chest wall pain Rib fracture  Minna Antis, MD 02/16/21 [id number]

## 2021-02-16 NOTE — ED Triage Notes (Signed)
Mechanical fall out of kitchen chair. Reports fell backwards and hit R ribcage on countertop. Denies hitting head or LOC. Reports R sided rib pain.

## 2021-02-17 NOTE — ED Provider Notes (Signed)
-----------------------------------------   12:29 AM on 02/17/2021 -----------------------------------------  CT head interpreted per Dr. Marisue Humble:  1. No acute intracranial abnormality. No skull fracture.  2. Stable atrophy and chronic small vessel ischemia.   Updated patient and family member of unremarkable CT head.  Patient is feeling better.  Room air saturations 98%.  Will discharge home with incentive spirometer and analgesia.  Patient will follow closely with her PCP.  Strict return precautions given.  Both verbalized understanding agree with plan of care.   Irean Hong, MD 02/17/21 [phone]

## 2021-03-04 ENCOUNTER — Emergency Department
Admission: EM | Admit: 2021-03-04 | Discharge: 2021-03-04 | Disposition: A | Discharge status: Home / Self Care | Payer: Medicare Other | Attending: Emergency Medicine | Admitting: Emergency Medicine

## 2021-03-04 ENCOUNTER — Encounter: Payer: Self-pay | Admitting: Emergency Medicine

## 2021-03-04 ENCOUNTER — Other Ambulatory Visit: Payer: Self-pay

## 2021-03-04 ENCOUNTER — Emergency Department: Payer: Medicare Other

## 2021-03-04 DIAGNOSIS — Z96651 Presence of right artificial knee joint: Secondary | ICD-10-CM | POA: Diagnosis not present

## 2021-03-04 DIAGNOSIS — J00 Acute nasopharyngitis [common cold]: Secondary | ICD-10-CM

## 2021-03-04 DIAGNOSIS — R519 Headache, unspecified: Secondary | ICD-10-CM | POA: Diagnosis present

## 2021-03-04 DIAGNOSIS — Z87891 Personal history of nicotine dependence: Secondary | ICD-10-CM | POA: Insufficient documentation

## 2021-03-04 DIAGNOSIS — U071 COVID-19: Secondary | ICD-10-CM | POA: Diagnosis not present

## 2021-03-04 LAB — CBC WITH DIFFERENTIAL/PLATELET
Abs Immature Granulocytes: 0.03 10*3/uL (ref 0.00–0.07)
Basophils Absolute: 0.1 10*3/uL (ref 0.0–0.1)
Basophils Relative: 1 %
Eosinophils Absolute: 0.1 10*3/uL (ref 0.0–0.5)
Eosinophils Relative: 1 %
HCT: 38.2 % (ref 36.0–46.0)
Hemoglobin: 13 g/dL (ref 12.0–15.0)
Immature Granulocytes: 0 %
Lymphocytes Relative: 12 %
Lymphs Abs: 1.1 10*3/uL (ref 0.7–4.0)
MCH: 31.5 pg (ref 26.0–34.0)
MCHC: 34 g/dL (ref 30.0–36.0)
MCV: 92.5 fL (ref 80.0–100.0)
Monocytes Absolute: 0.8 10*3/uL (ref 0.1–1.0)
Monocytes Relative: 9 %
Neutro Abs: 7 10*3/uL (ref 1.7–7.7)
Neutrophils Relative %: 77 %
Platelets: 268 10*3/uL (ref 150–400)
RBC: 4.13 MIL/uL (ref 3.87–5.11)
RDW: 12.6 % (ref 11.5–15.5)
WBC: 9.2 10*3/uL (ref 4.0–10.5)
nRBC: 0 % (ref 0.0–0.2)

## 2021-03-04 LAB — COMPREHENSIVE METABOLIC PANEL
ALT: 19 U/L (ref 0–44)
AST: 31 U/L (ref 15–41)
Albumin: 4.2 g/dL (ref 3.5–5.0)
Alkaline Phosphatase: 85 U/L (ref 38–126)
Anion gap: 9 (ref 5–15)
BUN: 13 mg/dL (ref 8–23)
CO2: 25 mmol/L (ref 22–32)
Calcium: 9.3 mg/dL (ref 8.9–10.3)
Chloride: 105 mmol/L (ref 98–111)
Creatinine, Ser: 0.92 mg/dL (ref 0.44–1.00)
GFR, Estimated: >60 mL/min (ref >60)
Glucose, Bld: 100 mg/dL — ABNORMAL HIGH (ref 70–99)
Potassium: 4.3 mmol/L (ref 3.5–5.1)
Sodium: 139 mmol/L (ref 135–145)
Total Bilirubin: 0.8 mg/dL (ref 0.3–1.2)
Total Protein: 7.7 g/dL (ref 6.5–8.1)

## 2021-03-04 LAB — TROPONIN I (HIGH SENSITIVITY): Troponin I (High Sensitivity): 5 ng/L (ref <18)

## 2021-03-04 LAB — RESP PANEL BY RT-PCR (FLU A&B, COVID) ARPGX2
Influenza A by PCR: NEGATIVE
Influenza B by PCR: NEGATIVE
SARS Coronavirus 2 by RT PCR: POSITIVE — AB

## 2021-03-04 NOTE — ED Provider Notes (Signed)
[facility] Emergency Department Provider Note   ____________________________________________   Event Date/Time   First MD Initiated Contact with Patient 03/04/21 1030     (approximate)  I have reviewed the triage vital signs and the nursing notes.   HISTORY  Chief Complaint Cough, Shortness of Breath, and Weakness    HPI Melissa Anthony is a 87 y.o. female with a past medical history of anxiety/depression, and GERD presents for 1 day of headache, rhinorrhea, and cough after an exposure to a Covid positive individual in her church [employer].  Patient denies any other complaints at this time.  Patient endorses being vaccinated for Covid.  Patient currently denies any vision changes, tinnitus, difficulty speaking, facial droop, sore throat, chest pain, shortness of breath, abdominal pain, nausea/vomiting/diarrhea, dysuria, or weakness/numbness/paresthesias in any extremity         Past Medical History:  Diagnosis Date  . Anxiety   . Arthritis   . Depression   . GERD (gastroesophageal reflux disease)     Patient Active Problem List   Diagnosis Date Noted  . CAP (community acquired pneumonia) 03/24/2018    Past Surgical History:  Procedure Laterality Date  . 23 GAUGE PARS PLANA VITRECTOMY WITH 23 GAUGE MVR PORT    . 23 GAUGE PARS PLANA VITRECTOMY WITH 23 GAUGE MVR PORT    . 23 GAUGE PARS PLANA VITRECTOMY WITH 23 GAUGE MVR PORT    . 23 GAUGE PARS PLANA VITRECTOMY WITH 23 GAUGE MVR PORT    . 23 GAUGE PARS PLANA VITRECTOMY WITH 23 GAUGE MVR PORT    . 23 GAUGE PARS PLANA VITRECTOMY WITH 23 GAUGE MVR PORT    . 23 GAUGE PARS PLANA VITRECTOMY WITH 23 GAUGE MVR PORT    . 23 GAUGE PARS PLANA VITRECTOMY WITH 23 GAUGE MVR PORT    . 23 GAUGE PARS PLANA VITRECTOMY WITH 23 GAUGE MVR PORT    . 23 GAUGE PARS PLANA VITRECTOMY WITH 23 GAUGE MVR PORT    . 23 GAUGE PARS PLANA VITRECTOMY WITH 23 GAUGE MVR PORT    . 23 GAUGE PARS PLANA VITRECTOMY WITH 23 GAUGE MVR PORT     . 23 GAUGE PARS PLANA VITRECTOMY WITH 23 GAUGE MVR PORT    . 23 GAUGE PARS PLANA VITRECTOMY WITH 23 GAUGE MVR PORT    . 23 GAUGE PARS PLANA VITRECTOMY WITH 23 GAUGE MVR PORT    . 23 GAUGE PARS PLANA VITRECTOMY WITH 23 GAUGE MVR PORT    . 23 GAUGE PARS PLANA VITRECTOMY WITH 23 GAUGE MVR PORT    . 23 GAUGE PARS PLANA VITRECTOMY WITH 23 GAUGE MVR PORT    . 23 GAUGE PARS PLANA VITRECTOMY WITH 23 GAUGE MVR PORT    . 23 GAUGE PARS PLANA VITRECTOMY WITH 23 GAUGE MVR PORT    . 23 GAUGE PARS PLANA VITRECTOMY WITH 23 GAUGE MVR PORT    . 23 GAUGE PARS PLANA VITRECTOMY WITH 23 GAUGE MVR PORT    . 23 GAUGE PARS PLANA VITRECTOMY WITH 23 GAUGE MVR PORT    . 23 GAUGE PARS PLANA VITRECTOMY WITH 23 GAUGE MVR PORT    . 23 GAUGE PARS PLANA VITRECTOMY WITH 23 GAUGE MVR PORT    . 23 GAUGE PARS PLANA VITRECTOMY WITH 23 GAUGE MVR PORT    . 23 GAUGE PARS PLANA VITRECTOMY WITH 23 GAUGE MVR PORT    . 23 GAUGE PARS PLANA VITRECTOMY WITH 23 GAUGE MVR PORT    . 23 GAUGE  PARS PLANA VITRECTOMY WITH 23 GAUGE MVR PORT    . 23 GAUGE PARS PLANA VITRECTOMY WITH 23 GAUGE MVR PORT    . 23 GAUGE PARS PLANA VITRECTOMY WITH 23 GAUGE MVR PORT    . 23 GAUGE PARS PLANA VITRECTOMY WITH 23 GAUGE MVR PORT    . 23 GAUGE PARS PLANA VITRECTOMY WITH 23 GAUGE MVR PORT    . 23 GAUGE PARS PLANA VITRECTOMY WITH 23 GAUGE MVR PORT    . 23 GAUGE PARS PLANA VITRECTOMY WITH 23 GAUGE MVR PORT    . 23 GAUGE PARS PLANA VITRECTOMY WITH 23 GAUGE MVR PORT    . 23 GAUGE PARS PLANA VITRECTOMY WITH 23 GAUGE MVR PORT    . 23 GAUGE PARS PLANA VITRECTOMY WITH 23 GAUGE MVR PORT    . 23 GAUGE PARS PLANA VITRECTOMY WITH 23 GAUGE MVR PORT    . 23 GAUGE PARS PLANA VITRECTOMY WITH 23 GAUGE MVR PORT    . 23 GAUGE PARS PLANA VITRECTOMY WITH 23 GAUGE MVR PORT    . 23 GAUGE PARS PLANA VITRECTOMY WITH 23 GAUGE MVR PORT    . 23 GAUGE PARS PLANA VITRECTOMY WITH 23 GAUGE MVR PORT    . 23 GAUGE PARS PLANA VITRECTOMY WITH 23 GAUGE MVR PORT    . 23 GAUGE PARS PLANA  VITRECTOMY WITH 23 GAUGE MVR PORT    . 23 GAUGE PARS PLANA VITRECTOMY WITH 23 GAUGE MVR PORT    . 23 GAUGE PARS PLANA VITRECTOMY WITH 23 GAUGE MVR PORT    . 23 GAUGE PARS PLANA VITRECTOMY WITH 23 GAUGE MVR PORT    . 23 GAUGE PARS PLANA VITRECTOMY WITH 23 GAUGE MVR PORT    . 23 GAUGE PARS PLANA VITRECTOMY WITH 23 GAUGE MVR PORT    . 23 GAUGE PARS PLANA VITRECTOMY WITH 23 GAUGE MVR PORT    . 23 GAUGE PARS PLANA VITRECTOMY WITH 23 GAUGE MVR PORT    . 23 GAUGE PARS PLANA VITRECTOMY WITH 23 GAUGE MVR PORT    . 23 GAUGE PARS PLANA VITRECTOMY WITH 23 GAUGE MVR PORT    . 23 GAUGE PARS PLANA VITRECTOMY WITH 23 GAUGE MVR PORT    . 23 GAUGE PARS PLANA VITRECTOMY WITH 23 GAUGE MVR PORT    . 23 GAUGE PARS PLANA VITRECTOMY WITH 23 GAUGE MVR PORT    . 23 GAUGE PARS PLANA VITRECTOMY WITH 23 GAUGE MVR PORT    . 23 GAUGE PARS PLANA VITRECTOMY WITH 23 GAUGE MVR PORT    . 23 GAUGE PARS PLANA VITRECTOMY WITH 23 GAUGE MVR PORT    . 23 GAUGE PARS PLANA VITRECTOMY WITH 23 GAUGE MVR PORT    . 23 GAUGE PARS PLANA VITRECTOMY WITH 23 GAUGE MVR PORT    . 23 GAUGE PARS PLANA VITRECTOMY WITH 23 GAUGE MVR PORT    . 23 GAUGE PARS PLANA VITRECTOMY WITH 23 GAUGE MVR PORT    . 23 GAUGE PARS PLANA VITRECTOMY WITH 23 GAUGE MVR PORT    . 23 GAUGE PARS PLANA VITRECTOMY WITH 23 GAUGE MVR PORT    . 23 GAUGE PARS PLANA VITRECTOMY WITH 23 GAUGE MVR PORT    . 23 GAUGE PARS PLANA VITRECTOMY WITH 23 GAUGE MVR PORT    . 23 GAUGE PARS PLANA VITRECTOMY WITH 23 GAUGE MVR PORT    . 23 GAUGE PARS PLANA VITRECTOMY WITH 23 GAUGE MVR PORT    . 23 GAUGE PARS PLANA VITRECTOMY WITH 23 GAUGE MVR PORT    .  23 GAUGE PARS PLANA VITRECTOMY WITH 23 GAUGE MVR PORT    . 23 GAUGE PARS PLANA VITRECTOMY WITH 23 GAUGE MVR PORT    . 23 GAUGE PARS PLANA VITRECTOMY WITH 23 GAUGE MVR PORT    . 23 GAUGE PARS PLANA VITRECTOMY WITH 23 GAUGE MVR PORT    . 23 GAUGE PARS PLANA VITRECTOMY WITH 23 GAUGE MVR PORT    . 23 GAUGE PARS PLANA VITRECTOMY WITH 23 GAUGE MVR  PORT    . 23 GAUGE PARS PLANA VITRECTOMY WITH 23 GAUGE MVR PORT    . 23 GAUGE PARS PLANA VITRECTOMY WITH 23 GAUGE MVR PORT    . 23 GAUGE PARS PLANA VITRECTOMY WITH 23 GAUGE MVR PORT    . 23 GAUGE PARS PLANA VITRECTOMY WITH 23 GAUGE MVR PORT    . 23 GAUGE PARS PLANA VITRECTOMY WITH 23 GAUGE MVR PORT    . 23 GAUGE PARS PLANA VITRECTOMY WITH 23 GAUGE MVR PORT    . 23 GAUGE PARS PLANA VITRECTOMY WITH 23 GAUGE MVR PORT    . 23 GAUGE PARS PLANA VITRECTOMY WITH 23 GAUGE MVR PORT    . 23 GAUGE PARS PLANA VITRECTOMY WITH 23 GAUGE MVR PORT    . 23 GAUGE PARS PLANA VITRECTOMY WITH 23 GAUGE MVR PORT    . 23 GAUGE PARS PLANA VITRECTOMY WITH 23 GAUGE MVR PORT    . 23 GAUGE PARS PLANA VITRECTOMY WITH 23 GAUGE MVR PORT    . ABDOMINAL HYSTERECTOMY    . APPENDECTOMY    . CATARACT EXTRACTION W/PHACO Left 06/16/2018   Procedure: CATARACT EXTRACTION PHACO AND INTRAOCULAR LENS PLACEMENT (IOC);  Surgeon: Galen Manila, MD;  Location: [facility] ORS;  Service: Ophthalmology;  Laterality: Left;  [place] 00:45.6 AP% 19.7 CDE 8.96 Fluid Pack lot # [id number]  . CHOLECYSTECTOMY    . COLON SURGERY     long time ago. can't remember why or what  . COLONOSCOPY    . EYE SURGERY Bilateral 2020   cataract extractions  . JOINT REPLACEMENT Right 2010   TKR  . TONSILLECTOMY      Prior to Admission medications   Medication Sig Start Date End Date Taking? Authorizing Provider  acetaminophen (TYLENOL) 325 MG tablet Take 650 mg by mouth every 6 (six) hours as needed for moderate pain.    [provider]  busPIRone (BUSPAR) 15 MG tablet Take 15 mg by mouth 2 (two) times daily.     [provider]  estradiol (ESTRACE) 1 MG tablet Take 1 mg by mouth daily.    [provider]  famotidine (PEPCID) 20 MG tablet Take 20 mg by mouth 2 (two) times daily. 08/27/19   [provider]  Multiple Vitamin (MULTIVITAMIN WITH MINERALS) TABS tablet Take 1 tablet by mouth daily.    [provider]  naproxen  sodium (ALEVE) 220 MG tablet Take 220-440 mg by mouth daily as needed (arthritis).     [provider]  oxyCODONE-acetaminophen (PERCOCET) 5-325 MG tablet Take 1 tablet by mouth every 4 (four) hours as needed for severe pain. 02/16/21   Minna Antis, MD  sertraline (ZOLOFT) 100 MG tablet Take 100 mg by mouth daily.    [provider]    Allergies Patient has no known allergies.  No family history on file.  Social History Social History   Tobacco Use  . Smoking status: Former Smoker    Types: Cigarettes    Quit date: 1980    Years since quitting: 42.3  .  Smokeless tobacco: Never Used  Vaping Use  . Vaping Use: Never used  Substance Use Topics  . Alcohol use: Never  . Drug use: Never    Review of Systems Constitutional: No fever/chills Eyes: No visual changes. ENT: Endorses sore throat. Cardiovascular: Denies chest pain. Respiratory: Denies shortness of breath. Gastrointestinal: No abdominal pain.  No nausea, no vomiting.  No diarrhea. Genitourinary: Negative for dysuria. Musculoskeletal: Negative for acute arthralgias Skin: Negative for rash. Neurological: Positive for headaches, weakness/numbness/paresthesias in any extremity Psychiatric: Negative for suicidal ideation/homicidal ideation   ____________________________________________   PHYSICAL EXAM:  VITAL SIGNS: ED Triage Vitals  Enc Vitals Group     BP 03/04/21 1025 119/67     Pulse Rate 03/04/21 1022 99     Resp 03/04/21 1022 16     Temp 03/04/21 1022 98.5 F (36.9 C)     Temp Source 03/04/21 1022 Oral     SpO2 03/04/21 1022 99 %     Weight 03/04/21 1023 164 lb 14.5 oz (74.8 kg)     Height 03/04/21 1023 5\' 4"  (1.626 m)     Head Circumference --      Peak Flow --      Pain Score 03/04/21 1023 0     Pain Loc --      Pain Edu? --      Excl. in GC? --    Constitutional: Alert and oriented. Well appearing and in no acute distress. Eyes: Conjunctivae are normal. PERRL. Head:  Atraumatic. Nose: Positive congestion/rhinnorhea. Mouth/Throat: Mucous membranes are erythematous. Neck: No stridor Cardiovascular: Grossly normal heart sounds.  Good peripheral circulation. Respiratory: Normal respiratory effort.  No retractions. Gastrointestinal: Soft and nontender. No distention. Musculoskeletal: No obvious deformities Neurologic:  Normal speech and language. No gross focal neurologic deficits are appreciated. Skin:  Skin is warm and dry. No rash noted. Psychiatric: Mood and affect are normal. Speech and behavior are normal.  ____________________________________________   LABS (all labs ordered are listed, but only abnormal results are displayed)  Labs Reviewed  RESP PANEL BY RT-PCR (FLU A&B, COVID) ARPGX2 - Abnormal; Notable for the following components:      Result Value   SARS Coronavirus 2 by RT PCR POSITIVE (*)    All other components within normal limits  COMPREHENSIVE METABOLIC PANEL - Abnormal; Notable for the following components:   Glucose, Bld 100 (*)    All other components within normal limits  CBC WITH DIFFERENTIAL/PLATELET  TROPONIN I (HIGH SENSITIVITY)   ____________________________________________  EKG  ED ECG REPORT I, 05/04/21, the attending physician, personally viewed and interpreted this ECG.  Date: 03/04/2021 EKG Time: 1110 Rate: 79 Rhythm: normal sinus rhythm QRS Axis: normal Intervals: normal ST/T Wave abnormalities: normal Narrative Interpretation: no evidence of acute ischemia  ____________________________________________  RADIOLOGY  ED MD interpretation: One-view portable chest x-ray shows no evidence of acute abnormalities including no pneumonia, pneumothorax, or widened mediastinum  Official radiology report(s): DG Chest Port 1 View  Result Date: 03/04/2021 CLINICAL DATA:  Cough.  COVID exposure. EXAM: PORTABLE CHEST 1 VIEW COMPARISON:  02/16/2021 FINDINGS: 1033 hours the lungs are clear without focal  pneumonia, edema, pneumothorax or pleural effusion. Interstitial markings are diffusely coarsened with chronic features. Cardiopericardial silhouette is at upper limits of normal for size. The visualized bony structures of the thorax show no acute abnormality. IMPRESSION: No active disease. Electronically Signed   By: 02/18/2021 M.D.   On: 03/04/2021 10:58    ____________________________________________   PROCEDURES  Procedure(s)  performed (including Critical Care):  .1-3 Lead EKG Interpretation Performed by: Merwyn KatosBradler, Tomika Eckles K, MD Authorized by: Merwyn KatosBradler, Kynzee Devinney K, MD     Interpretation: normal     ECG rate:  88   ECG rate assessment: normal     Rhythm: sinus rhythm     Ectopy: none     Conduction: normal       ____________________________________________   INITIAL IMPRESSION / ASSESSMENT AND PLAN / ED COURSE  As part of my medical decision making, I reviewed the following data within the electronic MEDICAL RECORD NUMBER Nursing notes reviewed and incorporated, Labs reviewed, EKG interpreted, Old chart reviewed, Radiograph reviewed and Notes from prior ED visits reviewed and incorporated        Presentation most consistent with Viral Syndrome.  Patient has tested positive for COVID-19. Based on vitals and exam they are nontoxic and stable for discharge.  Given History and Exam I have a lower suspicion for: Emergent CardioPulmonary causes [such as Acute Asthma or COPD Exacerbation, acute Heart Failure or exacerbation, PE, PTX, atypical ACS, PNA]. Emergent Otolaryngeal causes [such as PTA, RPA, Ludwigs, Epiglottitis, EBV].  Regarding Emergent Travel or Immunosuppressive related infectious: I have a low suspicion for acute HIV.  Will provide strict return precautions and instructions on self-isolation/quarantine and anticipatory guidance.      ____________________________________________   FINAL CLINICAL IMPRESSION(S) / ED DIAGNOSES  Final diagnoses:  COVID-19 virus  infection  Acute nasopharyngitis     ED Discharge Orders         Ordered    Ambulatory referral for Covid Treatment        03/04/21 1254           Note:  This document was prepared using Dragon voice recognition software and may include unintentional dictation errors.   Merwyn KatosBradler, Varnell Donate K, MD 03/04/21 [phone]

## 2021-03-04 NOTE — ED Notes (Signed)
Pt given water to encourage urine sample

## 2021-03-04 NOTE — ED Triage Notes (Signed)
Pt reports was exposed to covid a few days ago and has since developed symptoms of a head cold and feels weak. Family reports pt with hx of dementia

## 2021-03-05 ENCOUNTER — Telehealth: Payer: Self-pay

## 2021-03-05 NOTE — Telephone Encounter (Signed)
Called to discuss with patient about COVID-19 symptoms and the use of one of the available treatments for those with mild to moderate Covid symptoms and at a high risk of hospitalization.  Pt appears to qualify for outpatient treatment due to co-morbid conditions and/or a member of an at-risk group in accordance with the FDA Emergency Use Authorization.    Symptom onset: Cold symptoms 03/04/21 Vaccinated: Yes Booster? Yes Immunocompromised? No Qualifiers: Yes  Declines any further treatment at this time.   Melissa Anthony

## 2021-04-02 DIAGNOSIS — N1831 Chronic kidney disease, stage 3a: Secondary | ICD-10-CM | POA: Insufficient documentation

## 2022-01-07 ENCOUNTER — Other Ambulatory Visit: Payer: Self-pay

## 2022-01-07 ENCOUNTER — Encounter: Payer: Self-pay | Admitting: Urology

## 2022-01-07 ENCOUNTER — Ambulatory Visit: Payer: Medicare Other | Admitting: Urology

## 2022-01-07 VITALS — BP 130/70 | HR 72 | Ht 65.0 in | Wt 160.0 lb

## 2022-01-07 DIAGNOSIS — R32 Unspecified urinary incontinence: Secondary | ICD-10-CM | POA: Diagnosis not present

## 2022-01-07 DIAGNOSIS — R3129 Other microscopic hematuria: Secondary | ICD-10-CM | POA: Diagnosis not present

## 2022-01-07 DIAGNOSIS — N3946 Mixed incontinence: Secondary | ICD-10-CM | POA: Diagnosis not present

## 2022-01-07 LAB — URINALYSIS, COMPLETE
Bilirubin, UA: NEGATIVE
Glucose, UA: NEGATIVE
Ketones, UA: NEGATIVE
Leukocytes,UA: NEGATIVE
Nitrite, UA: NEGATIVE
Protein,UA: NEGATIVE
Specific Gravity, UA: <1.005 — ABNORMAL LOW (ref 1.005–1.030)
Urobilinogen, Ur: 0.2 mg/dL (ref 0.2–1.0)
pH, UA: 6 (ref 5.0–7.5)

## 2022-01-07 LAB — BLADDER SCAN AMB NON-IMAGING: Scan Result: 0

## 2022-01-07 LAB — MICROSCOPIC EXAMINATION: Bacteria, UA: NONE SEEN

## 2022-01-07 MED ORDER — MIRABEGRON ER 50 MG PO TB24: 50.0000 mg | ORAL_TABLET | Freq: Every day | ORAL | 0 refills | Status: AC

## 2022-01-07 NOTE — Progress Notes (Signed)
01/07/2022 9:14 AM   Melissa Anthony 1934-04-07 [id number]  Referring provider: No referring provider defined for this encounter.  Chief Complaint  Patient presents with   Urinary Incontinence    HPI: Melissa Anthony is a 88 y.o. female self-referred for urinary incontinence.  She presents today with her daughter  Seen 12/05/2021 with a 3-week history of urinary incontinence Urinalysis showed 4-10 RBCs; Urine culture with insignificant growth Treated with antibiotics without improvement in symptoms. Her daughter feels symptoms may have been going on for 3-4 months She has urinary frequency however she was unable to characterize if her incontinence was urge related Denies stress incontinence, dysuria, gross hematuria or recurrent UTI + bedwetting  PMH: Past Medical History:  Diagnosis Date   Anxiety    Arthritis    Depression    GERD (gastroesophageal reflux disease)     Surgical History: Past Surgical History:  Procedure Laterality Date   104 GAUGE PARS PLANA VITRECTOMY WITH 23 GAUGE MVR PORT     23 GAUGE PARS PLANA VITRECTOMY WITH 23 GAUGE MVR PORT     23 GAUGE PARS PLANA VITRECTOMY WITH 23 GAUGE MVR PORT     23 GAUGE PARS PLANA VITRECTOMY WITH 23 GAUGE MVR PORT     23 GAUGE PARS PLANA VITRECTOMY WITH 23 GAUGE MVR PORT     23 GAUGE PARS PLANA VITRECTOMY WITH 23 GAUGE MVR PORT     23 GAUGE PARS PLANA VITRECTOMY WITH 23 GAUGE MVR PORT     23 GAUGE PARS PLANA VITRECTOMY WITH 23 GAUGE MVR PORT     23 GAUGE PARS PLANA VITRECTOMY WITH 23 GAUGE MVR PORT     23 GAUGE PARS PLANA VITRECTOMY WITH 23 GAUGE MVR PORT     23 GAUGE PARS PLANA VITRECTOMY WITH 23 GAUGE MVR PORT     23 GAUGE PARS PLANA VITRECTOMY WITH 23 GAUGE MVR PORT     23 GAUGE PARS PLANA VITRECTOMY WITH 23 GAUGE MVR PORT     23 GAUGE PARS PLANA VITRECTOMY WITH 23 GAUGE MVR PORT     23 GAUGE PARS PLANA VITRECTOMY WITH 23 GAUGE MVR PORT     23 GAUGE PARS PLANA VITRECTOMY WITH 23 GAUGE MVR PORT     23 GAUGE PARS  PLANA VITRECTOMY WITH 23 GAUGE MVR PORT     23 GAUGE PARS PLANA VITRECTOMY WITH 23 GAUGE MVR PORT     23 GAUGE PARS PLANA VITRECTOMY WITH 23 GAUGE MVR PORT     23 GAUGE PARS PLANA VITRECTOMY WITH 23 GAUGE MVR PORT     23 GAUGE PARS PLANA VITRECTOMY WITH 23 GAUGE MVR PORT     23 GAUGE PARS PLANA VITRECTOMY WITH 23 GAUGE MVR PORT     23 GAUGE PARS PLANA VITRECTOMY WITH 23 GAUGE MVR PORT     23 GAUGE PARS PLANA VITRECTOMY WITH 23 GAUGE MVR PORT     23 GAUGE PARS PLANA VITRECTOMY WITH 23 GAUGE MVR PORT     23 GAUGE PARS PLANA VITRECTOMY WITH 23 GAUGE MVR PORT     23 GAUGE PARS PLANA VITRECTOMY WITH 23 GAUGE MVR PORT     23 GAUGE PARS PLANA VITRECTOMY WITH 23 GAUGE MVR PORT     23 GAUGE PARS PLANA VITRECTOMY WITH 23 GAUGE MVR PORT     23 GAUGE PARS PLANA VITRECTOMY WITH 23 GAUGE MVR PORT     23 GAUGE PARS PLANA VITRECTOMY WITH 23 GAUGE MVR PORT     23 GAUGE PARS PLANA  VITRECTOMY WITH 23 GAUGE MVR PORT     23 GAUGE PARS PLANA VITRECTOMY WITH 23 GAUGE MVR PORT     23 GAUGE PARS PLANA VITRECTOMY WITH 23 GAUGE MVR PORT     23 GAUGE PARS PLANA VITRECTOMY WITH 23 GAUGE MVR PORT     23 GAUGE PARS PLANA VITRECTOMY WITH 23 GAUGE MVR PORT     23 GAUGE PARS PLANA VITRECTOMY WITH 23 GAUGE MVR PORT     23 GAUGE PARS PLANA VITRECTOMY WITH 23 GAUGE MVR PORT     23 GAUGE PARS PLANA VITRECTOMY WITH 23 GAUGE MVR PORT     23 GAUGE PARS PLANA VITRECTOMY WITH 23 GAUGE MVR PORT     23 GAUGE PARS PLANA VITRECTOMY WITH 23 GAUGE MVR PORT     23 GAUGE PARS PLANA VITRECTOMY WITH 23 GAUGE MVR PORT     23 GAUGE PARS PLANA VITRECTOMY WITH 23 GAUGE MVR PORT     23 GAUGE PARS PLANA VITRECTOMY WITH 23 GAUGE MVR PORT     23 GAUGE PARS PLANA VITRECTOMY WITH 23 GAUGE MVR PORT     23 GAUGE PARS PLANA VITRECTOMY WITH 23 GAUGE MVR PORT     23 GAUGE PARS PLANA VITRECTOMY WITH 23 GAUGE MVR PORT     23 GAUGE PARS PLANA VITRECTOMY WITH 23 GAUGE MVR PORT     23 GAUGE PARS PLANA VITRECTOMY WITH 23 GAUGE MVR PORT     23 GAUGE PARS  PLANA VITRECTOMY WITH 23 GAUGE MVR PORT     23 GAUGE PARS PLANA VITRECTOMY WITH 23 GAUGE MVR PORT     23 GAUGE PARS PLANA VITRECTOMY WITH 23 GAUGE MVR PORT     23 GAUGE PARS PLANA VITRECTOMY WITH 23 GAUGE MVR PORT     23 GAUGE PARS PLANA VITRECTOMY WITH 23 GAUGE MVR PORT     23 GAUGE PARS PLANA VITRECTOMY WITH 23 GAUGE MVR PORT     23 GAUGE PARS PLANA VITRECTOMY WITH 23 GAUGE MVR PORT     23 GAUGE PARS PLANA VITRECTOMY WITH 23 GAUGE MVR PORT     23 GAUGE PARS PLANA VITRECTOMY WITH 23 GAUGE MVR PORT     23 GAUGE PARS PLANA VITRECTOMY WITH 23 GAUGE MVR PORT     23 GAUGE PARS PLANA VITRECTOMY WITH 23 GAUGE MVR PORT     23 GAUGE PARS PLANA VITRECTOMY WITH 23 GAUGE MVR PORT     23 GAUGE PARS PLANA VITRECTOMY WITH 23 GAUGE MVR PORT     23 GAUGE PARS PLANA VITRECTOMY WITH 23 GAUGE MVR PORT     23 GAUGE PARS PLANA VITRECTOMY WITH 23 GAUGE MVR PORT     23 GAUGE PARS PLANA VITRECTOMY WITH 23 GAUGE MVR PORT     23 GAUGE PARS PLANA VITRECTOMY WITH 23 GAUGE MVR PORT     23 GAUGE PARS PLANA VITRECTOMY WITH 23 GAUGE MVR PORT     23 GAUGE PARS PLANA VITRECTOMY WITH 23 GAUGE MVR PORT     23 GAUGE PARS PLANA VITRECTOMY WITH 23 GAUGE MVR PORT     23 GAUGE PARS PLANA VITRECTOMY WITH 23 GAUGE MVR PORT     23 GAUGE PARS PLANA VITRECTOMY WITH 23 GAUGE MVR PORT     23 GAUGE PARS PLANA VITRECTOMY WITH 23 GAUGE MVR PORT     23 GAUGE PARS PLANA VITRECTOMY WITH 23 GAUGE MVR PORT     23 GAUGE PARS PLANA VITRECTOMY WITH 23 GAUGE MVR PORT     23  GAUGE PARS PLANA VITRECTOMY WITH 23 GAUGE MVR PORT     23 GAUGE PARS PLANA VITRECTOMY WITH 23 GAUGE MVR PORT     23 GAUGE PARS PLANA VITRECTOMY WITH 23 GAUGE MVR PORT     23 GAUGE PARS PLANA VITRECTOMY WITH 23 GAUGE MVR PORT     23 GAUGE PARS PLANA VITRECTOMY WITH 23 GAUGE MVR PORT     23 GAUGE PARS PLANA VITRECTOMY WITH 23 GAUGE MVR PORT     23 GAUGE PARS PLANA VITRECTOMY WITH 23 GAUGE MVR PORT     23 GAUGE PARS PLANA VITRECTOMY WITH 23 GAUGE MVR PORT     23 GAUGE PARS  PLANA VITRECTOMY WITH 23 GAUGE MVR PORT     23 GAUGE PARS PLANA VITRECTOMY WITH 23 GAUGE MVR PORT     23 GAUGE PARS PLANA VITRECTOMY WITH 23 GAUGE MVR PORT     23 GAUGE PARS PLANA VITRECTOMY WITH 23 GAUGE MVR PORT     23 GAUGE PARS PLANA VITRECTOMY WITH 23 GAUGE MVR PORT     23 GAUGE PARS PLANA VITRECTOMY WITH 23 GAUGE MVR PORT     23 GAUGE PARS PLANA VITRECTOMY WITH 23 GAUGE MVR PORT     ABDOMINAL HYSTERECTOMY     APPENDECTOMY     CATARACT EXTRACTION W/PHACO Left 06/16/2018   Procedure: CATARACT EXTRACTION PHACO AND INTRAOCULAR LENS PLACEMENT (IOC);  Surgeon: Birder Robson, MD;  Location: [facility] ORS;  Service: Ophthalmology;  Laterality: Left;  [place] 00:45.6 AP% 19.7 CDE 8.96 Fluid Pack lot # [id number]   CHOLECYSTECTOMY     COLON SURGERY     long time ago. can't remember why or what   COLONOSCOPY     EYE SURGERY Bilateral 2020   cataract extractions   JOINT REPLACEMENT Right 2010   TKR   TONSILLECTOMY      Home Medications:  Allergies as of 01/07/2022   No Known Allergies      Medication List        Accurate as of January 07, 2022  9:14 AM. If you have any questions, ask your nurse or doctor.          acetaminophen 325 MG tablet Commonly known as: TYLENOL Take 650 mg by mouth every 6 (six) hours as needed for moderate pain.   busPIRone 15 MG tablet Commonly known as: BUSPAR Take 15 mg by mouth 2 (two) times daily.   estradiol 1 MG tablet Commonly known as: ESTRACE Take 1 mg by mouth daily.   famotidine 20 MG tablet Commonly known as: PEPCID Take 20 mg by mouth 2 (two) times daily.   multivitamin with minerals Tabs tablet Take 1 tablet by mouth daily.   naproxen sodium 220 MG tablet Commonly known as: ALEVE Take 220-440 mg by mouth daily as needed (arthritis).   oxyCODONE-acetaminophen 5-325 MG tablet Commonly known as: Percocet Take 1 tablet by mouth every 4 (four) hours as needed for severe pain.   sertraline 100 MG tablet Commonly known as:  ZOLOFT Take 100 mg by mouth daily.        Allergies: No Known Allergies  Family History: No family history on file.  Social History:  reports that she quit smoking about 43 years ago. Her smoking use included cigarettes. She has never used smokeless tobacco. She reports that she does not drink alcohol and does not use drugs.   Physical Exam: BP 130/70    Pulse 72    Ht 5\' 5"  (1.651 m)    Wt  160 lb (72.6 kg)    BMI 26.63 kg/m   Constitutional:  Alert and oriented, No acute distress. HEENT: [place] AT, moist mucus membranes.  Trachea midline, no masses. Cardiovascular: No clubbing, cyanosis, or edema. Respiratory: Normal respiratory effort, no increased work of breathing. GI: Abdomen is soft, nontender, nondistended, no abdominal masses Psychiatric: Normal mood and affect.  Laboratory Data:  Urinalysis Dipstick/microscopy negative   Assessment & Plan:    1.  Urinary incontinence Urinary incontinence Unable to characterize but most likely urge related Trial Myrbetriq 50 mg-samples given  2. Microhematuria AUA hematuria risk stratification: High based on age We discussed the recommend evaluation for high risk hematuria to include CT urogram and cystoscopy.  The procedures were discussed with further evaluation and she has elected to proceed   Abbie Sons, [place] [address], [place], [place] [address] [phone]

## 2022-01-22 ENCOUNTER — Ambulatory Visit
Admission: RE | Admit: 2022-01-22 | Discharge: 2022-01-22 | Disposition: A | Discharge status: Home / Self Care | Payer: Medicare Other | Source: Ambulatory Visit | Attending: Urology | Admitting: Urology

## 2022-01-22 ENCOUNTER — Other Ambulatory Visit: Payer: Self-pay

## 2022-01-22 DIAGNOSIS — R3129 Other microscopic hematuria: Secondary | ICD-10-CM | POA: Diagnosis not present

## 2022-01-22 LAB — POCT I-STAT CREATININE: Creatinine, Ser: 1.2 mg/dL — ABNORMAL HIGH (ref 0.44–1.00)

## 2022-01-22 MED ORDER — IOHEXOL 300 MG/ML  SOLN
100.0000 mL | Freq: Once | INTRAMUSCULAR | Status: AC | PRN
  Administered 2022-01-22: 80 mL via INTRAVENOUS

## 2022-01-30 ENCOUNTER — Telehealth: Payer: Self-pay | Admitting: Urology

## 2022-01-30 NOTE — Telephone Encounter (Signed)
She had a CT urogram and not a PET scan.  The CT urogram showed no abnormalities.  She is scheduled for cystoscopy on 3/16. ?

## 2022-01-30 NOTE — Telephone Encounter (Signed)
Pt's daughter, Rinaldo Cloud, [facility] asking about results from PET scan, she said, but I didn't see anything about this in her chart.   ?

## 2022-01-30 NOTE — Telephone Encounter (Signed)
Advised that we will call when Dr. Lonna Cobb looks at results  ?

## 2022-02-07 ENCOUNTER — Encounter: Payer: Self-pay | Admitting: Urology

## 2022-02-07 ENCOUNTER — Ambulatory Visit: Payer: Medicare Other | Admitting: Urology

## 2022-02-07 ENCOUNTER — Other Ambulatory Visit: Payer: Self-pay

## 2022-02-07 VITALS — BP 128/65 | HR 82 | Ht 65.0 in | Wt 160.0 lb

## 2022-02-07 DIAGNOSIS — R3129 Other microscopic hematuria: Secondary | ICD-10-CM | POA: Diagnosis not present

## 2022-02-07 NOTE — Progress Notes (Signed)
? ?  02/07/22 ? ?CC:  ?Chief Complaint  ?Patient presents with  ? Cysto  ? ? ?HPI: 88 y.o. female with urinary incontinence and microhematuria.  Refer to prior note 01/07/2022.  CT urogram showed no upper tract abnormalities.  No improvement in her incontinence with Myrbetriq.  Urinalysis today negative RBC/WBC ? ?Blood pressure 128/65, pulse 82, height 5\' 5"  (1.651 m), weight 160 lb (72.6 kg). ?NED. A&Ox3.   ?No respiratory distress   ?Abd soft, NT, ND ?Atrophic external genitalia with patent urethral meatus ? ?Cystoscopy Procedure Note ? ?Patient identification was confirmed, informed consent was obtained, and patient was prepped using Betadine solution.  Lidocaine jelly was administered per urethral meatus.   ? ?Procedure: ?- Flexible cystoscope introduced, without any difficulty.   ?- Thorough search of the bladder revealed: ?   normal urethral meatus ?   normal urothelium ?   no stones ?   no ulcers  ?   no tumors ?   no urethral polyps ?   no trabeculation ? ?- Ureteral orifices were normal in position and appearance. ? ?Post-Procedure: ?- Patient tolerated the procedure well ? ?Assessment/ Plan: ?No abnormalities on cystoscopy or upper tract imaging ?She is active and works in the yard regularly.  We discussed she could potentially benefit from pelvic floor physical therapy.  She was interested in a referral for more information ?She does have nighttime incontinence and bedwetting which is a more difficult problem to treat.  They were given the website for information on a Pure Wick device ? ? ? ? , MD ? ?

## 2022-02-07 NOTE — Patient Instructions (Signed)
purewick

## 2022-02-08 LAB — URINALYSIS, COMPLETE
Bilirubin, UA: NEGATIVE
Glucose, UA: NEGATIVE
Leukocytes,UA: NEGATIVE
Nitrite, UA: NEGATIVE
Protein,UA: NEGATIVE
Specific Gravity, UA: >1.03 — ABNORMAL HIGH (ref 1.005–1.030)
Urobilinogen, Ur: 0.2 mg/dL (ref 0.2–1.0)
pH, UA: 5.5 (ref 5.0–7.5)

## 2022-02-08 LAB — MICROSCOPIC EXAMINATION: Bacteria, UA: NONE SEEN

## 2022-03-22 ENCOUNTER — Other Ambulatory Visit: Payer: Self-pay

## 2022-03-22 ENCOUNTER — Emergency Department: Payer: Medicare Other

## 2022-03-22 DIAGNOSIS — Z87891 Personal history of nicotine dependence: Secondary | ICD-10-CM

## 2022-03-22 DIAGNOSIS — F419 Anxiety disorder, unspecified: Secondary | ICD-10-CM | POA: Diagnosis present

## 2022-03-22 DIAGNOSIS — Z90710 Acquired absence of both cervix and uterus: Secondary | ICD-10-CM

## 2022-03-22 DIAGNOSIS — L03115 Cellulitis of right lower limb: Secondary | ICD-10-CM | POA: Diagnosis present

## 2022-03-22 DIAGNOSIS — E785 Hyperlipidemia, unspecified: Secondary | ICD-10-CM | POA: Diagnosis present

## 2022-03-22 DIAGNOSIS — Z79890 Hormone replacement therapy (postmenopausal): Secondary | ICD-10-CM

## 2022-03-22 DIAGNOSIS — N179 Acute kidney failure, unspecified: Secondary | ICD-10-CM | POA: Diagnosis present

## 2022-03-22 DIAGNOSIS — Z79899 Other long term (current) drug therapy: Secondary | ICD-10-CM

## 2022-03-22 DIAGNOSIS — Z96651 Presence of right artificial knee joint: Secondary | ICD-10-CM | POA: Diagnosis present

## 2022-03-22 DIAGNOSIS — L03116 Cellulitis of left lower limb: Secondary | ICD-10-CM | POA: Diagnosis not present

## 2022-03-22 DIAGNOSIS — K219 Gastro-esophageal reflux disease without esophagitis: Secondary | ICD-10-CM | POA: Diagnosis present

## 2022-03-22 DIAGNOSIS — R32 Unspecified urinary incontinence: Secondary | ICD-10-CM | POA: Diagnosis present

## 2022-03-22 DIAGNOSIS — Z9049 Acquired absence of other specified parts of digestive tract: Secondary | ICD-10-CM

## 2022-03-22 DIAGNOSIS — M1990 Unspecified osteoarthritis, unspecified site: Secondary | ICD-10-CM | POA: Diagnosis present

## 2022-03-22 DIAGNOSIS — F0393 Unspecified dementia, unspecified severity, with mood disturbance: Secondary | ICD-10-CM | POA: Diagnosis present

## 2022-03-22 LAB — COMPREHENSIVE METABOLIC PANEL
ALT: 16 U/L (ref 0–44)
AST: 23 U/L (ref 15–41)
Albumin: 4 g/dL (ref 3.5–5.0)
Alkaline Phosphatase: 86 U/L (ref 38–126)
Anion gap: 12 (ref 5–15)
BUN: 25 mg/dL — ABNORMAL HIGH (ref 8–23)
CO2: 27 mmol/L (ref 22–32)
Calcium: 9.7 mg/dL (ref 8.9–10.3)
Chloride: 97 mmol/L — ABNORMAL LOW (ref 98–111)
Creatinine, Ser: 1.5 mg/dL — ABNORMAL HIGH (ref 0.44–1.00)
GFR, Estimated: 34 mL/min — ABNORMAL LOW (ref >60)
Glucose, Bld: 115 mg/dL — ABNORMAL HIGH (ref 70–99)
Potassium: 4 mmol/L (ref 3.5–5.1)
Sodium: 136 mmol/L (ref 135–145)
Total Bilirubin: 1.4 mg/dL — ABNORMAL HIGH (ref 0.3–1.2)
Total Protein: 8.8 g/dL — ABNORMAL HIGH (ref 6.5–8.1)

## 2022-03-22 LAB — CBC WITH DIFFERENTIAL/PLATELET
Abs Immature Granulocytes: 0.15 10*3/uL — ABNORMAL HIGH (ref 0.00–0.07)
Basophils Absolute: 0.1 10*3/uL (ref 0.0–0.1)
Basophils Relative: 0 %
Eosinophils Absolute: 0 10*3/uL (ref 0.0–0.5)
Eosinophils Relative: 0 %
HCT: 38.9 % (ref 36.0–46.0)
Hemoglobin: 12.7 g/dL (ref 12.0–15.0)
Immature Granulocytes: 1 %
Lymphocytes Relative: 8 %
Lymphs Abs: 2 10*3/uL (ref 0.7–4.0)
MCH: 30.6 pg (ref 26.0–34.0)
MCHC: 32.6 g/dL (ref 30.0–36.0)
MCV: 93.7 fL (ref 80.0–100.0)
Monocytes Absolute: 1.2 10*3/uL — ABNORMAL HIGH (ref 0.1–1.0)
Monocytes Relative: 5 %
Neutro Abs: 20.8 10*3/uL — ABNORMAL HIGH (ref 1.7–7.7)
Neutrophils Relative %: 86 %
Platelets: 311 10*3/uL (ref 150–400)
RBC: 4.15 MIL/uL (ref 3.87–5.11)
RDW: 13.5 % (ref 11.5–15.5)
WBC: 24.2 10*3/uL — ABNORMAL HIGH (ref 4.0–10.5)
nRBC: 0 % (ref 0.0–0.2)

## 2022-03-22 LAB — LACTIC ACID, PLASMA: Lactic Acid, Venous: 1.5 mmol/L (ref 0.5–1.9)

## 2022-03-22 NOTE — ED Triage Notes (Addendum)
Pt arrives with c/o left leg swelling and pain that started a few days ago. Pt left lower leg is swollen and red. Pt denies fever. Pt endorses fatigue. Pt has dementia.  ?

## 2022-03-23 ENCOUNTER — Encounter: Payer: Self-pay | Admitting: Internal Medicine

## 2022-03-23 ENCOUNTER — Inpatient Hospital Stay
Admission: EM | Admit: 2022-03-23 | Discharge: 2022-03-27 | DRG: 603 | Disposition: A | Discharge status: Home Health | Payer: Medicare Other | Attending: Internal Medicine | Admitting: Internal Medicine

## 2022-03-23 DIAGNOSIS — R32 Unspecified urinary incontinence: Secondary | ICD-10-CM | POA: Diagnosis present

## 2022-03-23 DIAGNOSIS — Z79899 Other long term (current) drug therapy: Secondary | ICD-10-CM | POA: Diagnosis not present

## 2022-03-23 DIAGNOSIS — Z87891 Personal history of nicotine dependence: Secondary | ICD-10-CM | POA: Diagnosis not present

## 2022-03-23 DIAGNOSIS — F0393 Unspecified dementia, unspecified severity, with mood disturbance: Secondary | ICD-10-CM | POA: Diagnosis present

## 2022-03-23 DIAGNOSIS — M1990 Unspecified osteoarthritis, unspecified site: Secondary | ICD-10-CM | POA: Diagnosis present

## 2022-03-23 DIAGNOSIS — E785 Hyperlipidemia, unspecified: Secondary | ICD-10-CM | POA: Diagnosis present

## 2022-03-23 DIAGNOSIS — L03116 Cellulitis of left lower limb: Secondary | ICD-10-CM | POA: Diagnosis present

## 2022-03-23 DIAGNOSIS — F419 Anxiety disorder, unspecified: Secondary | ICD-10-CM | POA: Diagnosis present

## 2022-03-23 DIAGNOSIS — Z90710 Acquired absence of both cervix and uterus: Secondary | ICD-10-CM | POA: Diagnosis not present

## 2022-03-23 DIAGNOSIS — L03115 Cellulitis of right lower limb: Secondary | ICD-10-CM | POA: Diagnosis present

## 2022-03-23 DIAGNOSIS — Z9049 Acquired absence of other specified parts of digestive tract: Secondary | ICD-10-CM | POA: Diagnosis not present

## 2022-03-23 DIAGNOSIS — Z96651 Presence of right artificial knee joint: Secondary | ICD-10-CM | POA: Diagnosis present

## 2022-03-23 DIAGNOSIS — D72829 Elevated white blood cell count, unspecified: Secondary | ICD-10-CM

## 2022-03-23 DIAGNOSIS — Z79890 Hormone replacement therapy (postmenopausal): Secondary | ICD-10-CM | POA: Diagnosis not present

## 2022-03-23 DIAGNOSIS — N179 Acute kidney failure, unspecified: Secondary | ICD-10-CM

## 2022-03-23 DIAGNOSIS — K219 Gastro-esophageal reflux disease without esophagitis: Secondary | ICD-10-CM

## 2022-03-23 DIAGNOSIS — F32A Depression, unspecified: Secondary | ICD-10-CM

## 2022-03-23 DIAGNOSIS — F0390 Unspecified dementia without behavioral disturbance: Secondary | ICD-10-CM

## 2022-03-23 LAB — CBC
HCT: 35.2 % — ABNORMAL LOW (ref 36.0–46.0)
Hemoglobin: 11.6 g/dL — ABNORMAL LOW (ref 12.0–15.0)
MCH: 31.2 pg (ref 26.0–34.0)
MCHC: 33 g/dL (ref 30.0–36.0)
MCV: 94.6 fL (ref 80.0–100.0)
Platelets: 275 10*3/uL (ref 150–400)
RBC: 3.72 MIL/uL — ABNORMAL LOW (ref 3.87–5.11)
RDW: 13.3 % (ref 11.5–15.5)
WBC: 23 10*3/uL — ABNORMAL HIGH (ref 4.0–10.5)
nRBC: 0 % (ref 0.0–0.2)

## 2022-03-23 LAB — BASIC METABOLIC PANEL
Anion gap: 10 (ref 5–15)
BUN: 27 mg/dL — ABNORMAL HIGH (ref 8–23)
CO2: 24 mmol/L (ref 22–32)
Calcium: 8.8 mg/dL — ABNORMAL LOW (ref 8.9–10.3)
Chloride: 101 mmol/L (ref 98–111)
Creatinine, Ser: 1.21 mg/dL — ABNORMAL HIGH (ref 0.44–1.00)
GFR, Estimated: 44 mL/min — ABNORMAL LOW (ref >60)
Glucose, Bld: 94 mg/dL (ref 70–99)
Potassium: 4.1 mmol/L (ref 3.5–5.1)
Sodium: 135 mmol/L (ref 135–145)

## 2022-03-23 LAB — PROCALCITONIN: Procalcitonin: 3.37 ng/mL

## 2022-03-23 MED ORDER — BUSPIRONE HCL 10 MG PO TABS
15.0000 mg | ORAL_TABLET | Freq: Two times a day (BID) | ORAL | Status: DC
  Administered 2022-03-23 – 2022-03-27 (×9): 15 mg via ORAL
  Filled 2022-03-23 (×7): qty 2
  Filled 2022-03-23: qty 1.5
  Filled 2022-03-23: qty 2

## 2022-03-23 MED ORDER — ENOXAPARIN SODIUM 30 MG/0.3ML IJ SOSY: 30.0000 mg | PREFILLED_SYRINGE | INTRAMUSCULAR | Status: DC

## 2022-03-23 MED ORDER — MIRABEGRON ER 50 MG PO TB24
50.0000 mg | ORAL_TABLET | Freq: Every day | ORAL | Status: DC
  Administered 2022-03-23 – 2022-03-27 (×5): 50 mg via ORAL
  Filled 2022-03-23 (×5): qty 1

## 2022-03-23 MED ORDER — ONDANSETRON HCL 4 MG PO TABS: 4.0000 mg | ORAL_TABLET | Freq: Four times a day (QID) | ORAL | Status: DC | PRN

## 2022-03-23 MED ORDER — ACETAMINOPHEN 650 MG RE SUPP: 650.0000 mg | Freq: Four times a day (QID) | RECTAL | Status: DC | PRN

## 2022-03-23 MED ORDER — ONDANSETRON HCL 4 MG/2ML IJ SOLN
4.0000 mg | Freq: Four times a day (QID) | INTRAMUSCULAR | Status: DC | PRN
Start: 2022-03-23 — End: 2022-03-27

## 2022-03-23 MED ORDER — MORPHINE SULFATE (PF) 2 MG/ML IV SOLN: 2.0000 mg | INTRAVENOUS | Status: DC | PRN

## 2022-03-23 MED ORDER — VANCOMYCIN HCL 1750 MG/350ML IV SOLN
1750.0000 mg | Freq: Once | INTRAVENOUS | Status: AC
  Administered 2022-03-23: 1750 mg via INTRAVENOUS
  Filled 2022-03-23: qty 350

## 2022-03-23 MED ORDER — ACETAMINOPHEN 325 MG PO TABS
650.0000 mg | ORAL_TABLET | Freq: Four times a day (QID) | ORAL | Status: DC | PRN
  Administered 2022-03-24: 650 mg via ORAL
  Filled 2022-03-23: qty 2

## 2022-03-23 MED ORDER — SODIUM CHLORIDE 0.9 % IV SOLN
2.0000 g | INTRAVENOUS | Status: DC
  Administered 2022-03-24 – 2022-03-25 (×2): 2 g via INTRAVENOUS
  Filled 2022-03-23 (×2): qty 2

## 2022-03-23 MED ORDER — VANCOMYCIN HCL IN DEXTROSE 1-5 GM/200ML-% IV SOLN
1000.0000 mg | Freq: Once | INTRAVENOUS | Status: DC
  Filled 2022-03-23: qty 200

## 2022-03-23 MED ORDER — SODIUM CHLORIDE 0.9 % IV SOLN: INTRAVENOUS | Status: DC

## 2022-03-23 MED ORDER — SODIUM CHLORIDE 0.9 % IV SOLN
2.0000 g | Freq: Once | INTRAVENOUS | Status: AC
  Administered 2022-03-23: 2 g via INTRAVENOUS
  Filled 2022-03-23: qty 20

## 2022-03-23 MED ORDER — VANCOMYCIN HCL 1500 MG/300ML IV SOLN: 1500.0000 mg | INTRAVENOUS | Status: DC

## 2022-03-23 MED ORDER — SERTRALINE HCL 50 MG PO TABS
100.0000 mg | ORAL_TABLET | Freq: Every day | ORAL | Status: DC
  Administered 2022-03-23 – 2022-03-27 (×5): 100 mg via ORAL
  Filled 2022-03-23 (×5): qty 2

## 2022-03-23 MED ORDER — VANCOMYCIN HCL 750 MG/150ML IV SOLN
750.0000 mg | INTRAVENOUS | Status: DC
  Filled 2022-03-23: qty 150

## 2022-03-23 MED ORDER — ENOXAPARIN SODIUM 40 MG/0.4ML IJ SOSY
40.0000 mg | PREFILLED_SYRINGE | Freq: Every day | INTRAMUSCULAR | Status: DC
  Administered 2022-03-23 – 2022-03-24 (×2): 40 mg via SUBCUTANEOUS
  Filled 2022-03-23 (×2): qty 0.4

## 2022-03-23 MED ORDER — DONEPEZIL HCL 5 MG PO TABS
10.0000 mg | ORAL_TABLET | Freq: Every day | ORAL | Status: DC
  Administered 2022-03-23 – 2022-03-27 (×5): 10 mg via ORAL
  Filled 2022-03-23 (×5): qty 2

## 2022-03-23 MED ORDER — OXYCODONE-ACETAMINOPHEN 5-325 MG PO TABS
1.0000 | ORAL_TABLET | ORAL | Status: DC | PRN
Start: 2022-03-23 — End: 2022-03-27
  Administered 2022-03-24 – 2022-03-25 (×3): 1 via ORAL
  Filled 2022-03-23 (×3): qty 1

## 2022-03-23 MED ORDER — HYDROCODONE-ACETAMINOPHEN 5-325 MG PO TABS
1.0000 | ORAL_TABLET | ORAL | Status: DC | PRN
  Administered 2022-03-26: 1 via ORAL
  Filled 2022-03-23: qty 1

## 2022-03-23 MED ORDER — MIRTAZAPINE 15 MG PO TABS
15.0000 mg | ORAL_TABLET | Freq: Every day | ORAL | Status: DC
Start: 2022-03-23 — End: 2022-03-27
  Administered 2022-03-23 – 2022-03-26 (×4): 15 mg via ORAL
  Filled 2022-03-23 (×4): qty 1

## 2022-03-23 NOTE — Assessment & Plan Note (Addendum)
Still significant leg swelling and tender to touch.  Continue Rocephin and vancomycin. ?

## 2022-03-23 NOTE — Plan of Care (Signed)
Initial add ? ? ?Problem: Clinical Measurements: ?Goal: Ability to avoid or minimize complications of infection will improve ?Outcome: Not Applicable ?  ?Problem: Skin Integrity: ?Goal: Skin integrity will improve ?Outcome: Not Applicable ?  ?Problem: Education: ?Goal: Knowledge of General Education information will improve ?Description: Including pain rating scale, medication(s)/side effects and non-pharmacologic comfort measures ?Outcome: Not Applicable ?  ?Problem: Health Behavior/Discharge Planning: ?Goal: Ability to manage health-related needs will improve ?Outcome: Not Applicable ?  ?Problem: Clinical Measurements: ?Goal: Ability to maintain clinical measurements within normal limits will improve ?Outcome: Not Applicable ?Goal: Will remain free from infection ?Outcome: Not Applicable ?Goal: Diagnostic test results will improve ?Outcome: Not Applicable ?Goal: Respiratory complications will improve ?Outcome: Not Applicable ?Goal: Cardiovascular complication will be avoided ?Outcome: Not Applicable ?  ?Problem: Activity: ?Goal: Risk for activity intolerance will decrease ?Outcome: Not Applicable ?  ?Problem: Nutrition: ?Goal: Adequate nutrition will be maintained ?Outcome: Not Applicable ?  ?Problem: Coping: ?Goal: Level of anxiety will decrease ?Outcome: Not Applicable ?  ?Problem: Elimination: ?Goal: Will not experience complications related to bowel motility ?Outcome: Not Applicable ?Goal: Will not experience complications related to urinary retention ?Outcome: Not Applicable ?  ?Problem: Pain Managment: ?Goal: General experience of comfort will improve ?Outcome: Not Applicable ?  ?Problem: Safety: ?Goal: Ability to remain free from injury will improve ?Outcome: Not Applicable ?  ?Problem: Skin Integrity: ?Goal: Risk for impaired skin integrity will decrease ?Outcome: Not Applicable ?  ?

## 2022-03-23 NOTE — Progress Notes (Signed)
Pharmacy Antibiotic Note ? ?Melissa Anthony is a 88 y.o. female admitted on 03/23/2022 with cellulitis.  Pharmacy has been consulted for Vancomycin dosing. ? ?Plan: ?Vancomycin 1750 mg IV X 1 ordered for 4/29 @ 0300. ?Vancomycin 1500 mg IV Q48H ordered to start on 5/1 @ 0300. ? ?AUC = 543 ?Vanc trough = 12.2  ? ?Height: 5\' 5"  (165.1 cm) ?Weight: 77.1 kg (170 lb) ?IBW/kg (Calculated) : 57 ? ?Temp (24hrs), Avg:99 ?F (37.2 ?C), Min:99 ?F (37.2 ?C), Max:99 ?F (37.2 ?C) ? ?Recent Labs  ?Lab 03/22/22 ?2047  ?WBC 24.2*  ?CREATININE 1.50*  ?LATICACIDVEN 1.5  ?  ?Estimated Creatinine Clearance: 28.1 mL/min (A) (by C-G formula based on SCr of 1.5 mg/dL (H)).   ? ?No Known Allergies ? ?Antimicrobials this admission: ?  >>  ?  >>  ? ?Dose adjustments this admission: ? ? ?Microbiology results: ? BCx:  ? UCx:   ? Sputum:   ? MRSA PCR:  ? ?Thank you for allowing pharmacy to be a part of this patient?s care. ? ?Detravion Tester D ?03/23/2022 2:14 AM ? ?

## 2022-03-23 NOTE — Assessment & Plan Note (Addendum)
Renal function improving, patient has adequate p.o. intake.  Discontinue fluids ?

## 2022-03-23 NOTE — Assessment & Plan Note (Addendum)
Reordered home medicines. ?

## 2022-03-23 NOTE — Assessment & Plan Note (Addendum)
On famotidine

## 2022-03-23 NOTE — Progress Notes (Signed)
Anticoagulation monitoring(Lovenox): ? ?88 yo female ordered Lovenox 40 mg Q24h ?   ?Filed Weights  ? 03/22/22 2043  ?Weight: 77.1 kg (170 lb)  ? ?BMI   ? ?Lab Results  ?Component Value Date  ? CREATININE 1.50 (H) 03/22/2022  ? CREATININE 1.20 (H) 01/22/2022  ? CREATININE 0.92 03/04/2021  ? ?Estimated Creatinine Clearance: 28.1 mL/min (A) (by C-G formula based on SCr of 1.5 mg/dL (H)). ?Hemoglobin & Hematocrit  ?   ?Component Value Date/Time  ? HGB 12.7 03/22/2022 2047  ? HGB 10.4 (L) 09/16/2012 0346  ? HCT 38.9 03/22/2022 2047  ? HCT 37.2 08/31/2012 0855  ? ? ? ?Per Protocol for Patient with estCrcl < 30 ml/min and BMI < 30, will transition to Lovenox 30 mg Q24h.  ?  ? ? ?

## 2022-03-23 NOTE — Progress Notes (Addendum)
?  Progress Note ? ? ?Patient: Melissa Anthony [id number] DOB: [birth year] DOA: 03/23/2022     0 ?DOS: the patient was seen and examined on 03/23/2022 ?  ?Brief hospital course: ?ALEXIES JETTER is a 88 y.o. female with medical history significant for Depression and anxiety, dementia, GERD and urinary incontinence who presents to the ED with a complaint of left lower extremity pain and swelling that started a few days prior.  She denies fever or chills. ?She is diagnosed with left lower extremity cellulitis, was started on antibiotics with Rocephin and vancomycin. ? ?Assessment and Plan: ?* Cellulitis of left lower extremity ?Still significant leg swelling and tender to touch.  Continue Rocephin and vancomycin. ? ?AKI (acute kidney injury) ([place]) ?Renal function improving, patient has adequate p.o. intake.  Discontinue fluids ? ?Dementia without behavioral disturbance ([place]) ?Reordered home medicines ? ?Depression ?Reordered home medicines. ? ?GERD (gastroesophageal reflux disease) ?On famotidine ? ? ? ? ?  ? ?Subjective:  ?Patient still has left leg swelling and pain.  No fever or chills ? ?Physical Exam: ?Vitals:  ? 03/23/22 0201 03/23/22 0204 03/23/22 0247 03/23/22 0745  ?BP:   136/66 135/62  ?Pulse: 80 86 81 84  ?Resp:   16 17  ?Temp:   98.4 ?F (36.9 ?C) 99.4 ?F (37.4 ?C)  ?TempSrc:   Oral Oral  ?SpO2: 97% 98% 96% 99%  ?Weight:      ?Height:      ? ?General exam: Appears calm and comfortable  ?Respiratory system: Clear to auscultation. Respiratory effort normal. ?Cardiovascular system: S1 & S2 heard, RRR. No JVD, murmurs, rubs, gallops or clicks.  ?Gastrointestinal system: Abdomen is nondistended, soft and nontender. No organomegaly or masses felt. Normal bowel sounds heard. ?Central nervous system: Alert and oriented. No focal neurological deficits. ?Extremities: Left leg swelling, red and tender to touch. ?Skin: No rashes, lesions or ulcers ?Psychiatry: Judgement and insight appear normal. Mood & affect  appropriate.  ? ?Data Reviewed: ? ?Reviewed lab results. ? ?Family Communication: daughter updated. ? ?Disposition: ?Status is: Inpatient ?Remains inpatient appropriate because: Severity of disease and IV antibiotics ? Planned Discharge Destination: Home with Home Health ? ? ? ?Time spent:  minutes ?No charge ? ?Author: Sharen Hones, MD ?03/23/2022 10:03 AM ? ?For on call review [email or web address].  ?

## 2022-03-23 NOTE — H&P (Signed)
?History and Physical  ? ? ?Patient: Melissa Anthony BTD:[id number] DOB: 1933-12-12 ?DOA: 03/23/2022 ?DOS: the patient was seen and examined on 03/23/2022 ?PCP: Lauro Regulus, MD  ?Patient coming from: Home ? ?Chief Complaint:  ?Chief Complaint  ?Patient presents with  ? Leg Swelling  ? ? ?HPI: Melissa Anthony is a 88 y.o. female with medical history significant for Depression and anxiety, dementia, GERD and urinary incontinence who presents to the ED with a complaint of left lower extremity pain and swelling that started a few days prior.  She denies fever or chills.  She denies prior injury. ?ED course and data review: Temperature 99 with pulse of 96, BP 141/90.  WBC 24,000 with lactic acid 1.5.  Creatinine 1.5 above baseline of 0.92.  Total bilirubin noted to be 1.4.  Chest x-ray with no active cardiopulmonary disease.  Patient started on Rocephin and vancomycin and hospitalist consulted for admission.    ? ?Review of Systems: As mentioned in the history of present illness. All other systems reviewed and are negative. ?Past Medical History:  ?Diagnosis Date  ? Anxiety   ? Arthritis   ? Depression   ? GERD (gastroesophageal reflux disease)   ? ?Past Surgical History:  ?Procedure Laterality Date  ? 23 GAUGE PARS PLANA VITRECTOMY WITH 23 GAUGE MVR PORT    ? 23 GAUGE PARS PLANA VITRECTOMY WITH 23 GAUGE MVR PORT    ? 23 GAUGE PARS PLANA VITRECTOMY WITH 23 GAUGE MVR PORT    ? 23 GAUGE PARS PLANA VITRECTOMY WITH 23 GAUGE MVR PORT    ? 23 GAUGE PARS PLANA VITRECTOMY WITH 23 GAUGE MVR PORT    ? 23 GAUGE PARS PLANA VITRECTOMY WITH 23 GAUGE MVR PORT    ? 23 GAUGE PARS PLANA VITRECTOMY WITH 23 GAUGE MVR PORT    ? 23 GAUGE PARS PLANA VITRECTOMY WITH 23 GAUGE MVR PORT    ? 23 GAUGE PARS PLANA VITRECTOMY WITH 23 GAUGE MVR PORT    ? 23 GAUGE PARS PLANA VITRECTOMY WITH 23 GAUGE MVR PORT    ? 23 GAUGE PARS PLANA VITRECTOMY WITH 23 GAUGE MVR PORT    ? 23 GAUGE PARS PLANA VITRECTOMY WITH 23 GAUGE MVR PORT    ? 23 GAUGE PARS  PLANA VITRECTOMY WITH 23 GAUGE MVR PORT    ? 23 GAUGE PARS PLANA VITRECTOMY WITH 23 GAUGE MVR PORT    ? 23 GAUGE PARS PLANA VITRECTOMY WITH 23 GAUGE MVR PORT    ? 23 GAUGE PARS PLANA VITRECTOMY WITH 23 GAUGE MVR PORT    ? 23 GAUGE PARS PLANA VITRECTOMY WITH 23 GAUGE MVR PORT    ? 23 GAUGE PARS PLANA VITRECTOMY WITH 23 GAUGE MVR PORT    ? 23 GAUGE PARS PLANA VITRECTOMY WITH 23 GAUGE MVR PORT    ? 23 GAUGE PARS PLANA VITRECTOMY WITH 23 GAUGE MVR PORT    ? 23 GAUGE PARS PLANA VITRECTOMY WITH 23 GAUGE MVR PORT    ? 23 GAUGE PARS PLANA VITRECTOMY WITH 23 GAUGE MVR PORT    ? 23 GAUGE PARS PLANA VITRECTOMY WITH 23 GAUGE MVR PORT    ? 23 GAUGE PARS PLANA VITRECTOMY WITH 23 GAUGE MVR PORT    ? 23 GAUGE PARS PLANA VITRECTOMY WITH 23 GAUGE MVR PORT    ? 23 GAUGE PARS PLANA VITRECTOMY WITH 23 GAUGE MVR PORT    ? 23 GAUGE PARS PLANA VITRECTOMY WITH 23 GAUGE MVR PORT    ? 23 GAUGE PARS PLANA  VITRECTOMY WITH 23 GAUGE MVR PORT    ? 23 GAUGE PARS PLANA VITRECTOMY WITH 23 GAUGE MVR PORT    ? 23 GAUGE PARS PLANA VITRECTOMY WITH 23 GAUGE MVR PORT    ? 23 GAUGE PARS PLANA VITRECTOMY WITH 23 GAUGE MVR PORT    ? 23 GAUGE PARS PLANA VITRECTOMY WITH 23 GAUGE MVR PORT    ? 23 GAUGE PARS PLANA VITRECTOMY WITH 23 GAUGE MVR PORT    ? 23 GAUGE PARS PLANA VITRECTOMY WITH 23 GAUGE MVR PORT    ? 23 GAUGE PARS PLANA VITRECTOMY WITH 23 GAUGE MVR PORT    ? 23 GAUGE PARS PLANA VITRECTOMY WITH 23 GAUGE MVR PORT    ? 23 GAUGE PARS PLANA VITRECTOMY WITH 23 GAUGE MVR PORT    ? 23 GAUGE PARS PLANA VITRECTOMY WITH 23 GAUGE MVR PORT    ? 23 GAUGE PARS PLANA VITRECTOMY WITH 23 GAUGE MVR PORT    ? 23 GAUGE PARS PLANA VITRECTOMY WITH 23 GAUGE MVR PORT    ? 23 GAUGE PARS PLANA VITRECTOMY WITH 23 GAUGE MVR PORT    ? 23 GAUGE PARS PLANA VITRECTOMY WITH 23 GAUGE MVR PORT    ? 23 GAUGE PARS PLANA VITRECTOMY WITH 23 GAUGE MVR PORT    ? 23 GAUGE PARS PLANA VITRECTOMY WITH 23 GAUGE MVR PORT    ? 23 GAUGE PARS PLANA VITRECTOMY WITH 23 GAUGE MVR PORT    ? 23 GAUGE PARS  PLANA VITRECTOMY WITH 23 GAUGE MVR PORT    ? 23 GAUGE PARS PLANA VITRECTOMY WITH 23 GAUGE MVR PORT    ? 23 GAUGE PARS PLANA VITRECTOMY WITH 23 GAUGE MVR PORT    ? 23 GAUGE PARS PLANA VITRECTOMY WITH 23 GAUGE MVR PORT    ? 23 GAUGE PARS PLANA VITRECTOMY WITH 23 GAUGE MVR PORT    ? 23 GAUGE PARS PLANA VITRECTOMY WITH 23 GAUGE MVR PORT    ? 23 GAUGE PARS PLANA VITRECTOMY WITH 23 GAUGE MVR PORT    ? 23 GAUGE PARS PLANA VITRECTOMY WITH 23 GAUGE MVR PORT    ? 23 GAUGE PARS PLANA VITRECTOMY WITH 23 GAUGE MVR PORT    ? 23 GAUGE PARS PLANA VITRECTOMY WITH 23 GAUGE MVR PORT    ? 23 GAUGE PARS PLANA VITRECTOMY WITH 23 GAUGE MVR PORT    ? 23 GAUGE PARS PLANA VITRECTOMY WITH 23 GAUGE MVR PORT    ? 23 GAUGE PARS PLANA VITRECTOMY WITH 23 GAUGE MVR PORT    ? 23 GAUGE PARS PLANA VITRECTOMY WITH 23 GAUGE MVR PORT    ? 23 GAUGE PARS PLANA VITRECTOMY WITH 23 GAUGE MVR PORT    ? 23 GAUGE PARS PLANA VITRECTOMY WITH 23 GAUGE MVR PORT    ? 23 GAUGE PARS PLANA VITRECTOMY WITH 23 GAUGE MVR PORT    ? 23 GAUGE PARS PLANA VITRECTOMY WITH 23 GAUGE MVR PORT    ? 23 GAUGE PARS PLANA VITRECTOMY WITH 23 GAUGE MVR PORT    ? 23 GAUGE PARS PLANA VITRECTOMY WITH 23 GAUGE MVR PORT    ? 23 GAUGE PARS PLANA VITRECTOMY WITH 23 GAUGE MVR PORT    ? 23 GAUGE PARS PLANA VITRECTOMY WITH 23 GAUGE MVR PORT    ? 23 GAUGE PARS PLANA VITRECTOMY WITH 23 GAUGE MVR PORT    ? 23 GAUGE PARS PLANA VITRECTOMY WITH 23 GAUGE MVR PORT    ? 23 GAUGE PARS PLANA VITRECTOMY WITH 23 GAUGE MVR PORT    ? 23  GAUGE PARS PLANA VITRECTOMY WITH 23 GAUGE MVR PORT    ? 23 GAUGE PARS PLANA VITRECTOMY WITH 23 GAUGE MVR PORT    ? 23 GAUGE PARS PLANA VITRECTOMY WITH 23 GAUGE MVR PORT    ? 23 GAUGE PARS PLANA VITRECTOMY WITH 23 GAUGE MVR PORT    ? 23 GAUGE PARS PLANA VITRECTOMY WITH 23 GAUGE MVR PORT    ? 23 GAUGE PARS PLANA VITRECTOMY WITH 23 GAUGE MVR PORT    ? 23 GAUGE PARS PLANA VITRECTOMY WITH 23 GAUGE MVR PORT    ? 23 GAUGE PARS PLANA VITRECTOMY WITH 23 GAUGE MVR PORT    ? 23 GAUGE PARS  PLANA VITRECTOMY WITH 23 GAUGE MVR PORT    ? 23 GAUGE PARS PLANA VITRECTOMY WITH 23 GAUGE MVR PORT    ? 23 GAUGE PARS PLANA VITRECTOMY WITH 23 GAUGE MVR PORT    ? 23 GAUGE PARS PLANA VITRECTOMY WITH 23 GAUGE MVR PORT    ? 23 GAUGE PARS PLANA VITRECTOMY WITH 23 GAUGE MVR PORT    ? 23 GAUGE PARS PLANA VITRECTOMY WITH 23 GAUGE MVR PORT    ? 23 GAUGE PARS PLANA VITRECTOMY WITH 23 GAUGE MVR PORT    ? 23 GAUGE PARS PLANA VITRECTOMY WITH 23 GAUGE MVR PORT    ? 23 GAUGE PARS PLANA VITRECTOMY WITH 23 GAUGE MVR PORT    ? 23 GAUGE PARS PLANA VITRECTOMY WITH 23 GAUGE MVR PORT    ? 23 GAUGE PARS PLANA VITRECTOMY WITH 23 GAUGE MVR PORT    ? ABDOMINAL HYSTERECTOMY    ? APPENDECTOMY    ? CATARACT EXTRACTION W/PHACO Left 06/16/2018  ? Procedure: CATARACT EXTRACTION PHACO AND INTRAOCULAR LENS PLACEMENT (IOC);  Surgeon: Galen ManilaPorfilio, William, MD;  Location: [facility] ORS;  Service: Ophthalmology;  Laterality: Left;  US 00:45.6 ?AP% 19.7 ?CDE 8.96 ?Fluid Pack lot # [id number]  ? CHOLECYSTECTOMY    ? COLON SURGERY    ? long time ago. can't remember why or what  ? COLONOSCOPY    ? EYE SURGERY Bilateral 2020  ? cataract extractions  ? JOINT REPLACEMENT Right 2010  ? TKR  ? TONSILLECTOMY    ? ?Social History:  reports that she quit smoking about 43 years ago. Her smoking use included cigarettes. She has never used smokeless tobacco. She reports that she does not drink alcohol and does not use drugs. ? ?No Known Allergies ? ?No family history on file. ? ?Prior to Admission medications   ?Medication Sig Start Date End Date Taking? Authorizing Provider  ?acetaminophen (TYLENOL) 325 MG tablet Take 650 mg by mouth every 6 (six) hours as needed for moderate pain.    [provider]  ?busPIRone (BUSPAR) 15 MG tablet Take 15 mg by mouth 2 (two) times daily.     [provider]  ?donepezil (ARICEPT) 10 MG tablet Take 10 mg by mouth daily. 12/26/21   [provider]  ?estradiol (ESTRACE) 1 MG tablet Take 1 mg by mouth daily.    [provider]  ?famotidine (PEPCID) 20 MG tablet Take 20 mg by mouth 2 (two) times daily. 08/27/19   [provider]  ?mirabegron ER (MYRBETRIQ) 50 MG TB24 tablet Take 1 tablet (50 mg total) by mout

## 2022-03-23 NOTE — ED Provider Notes (Signed)
? ?[facility] ?Provider Note ? ? ? Event Date/Time  ? First MD Initiated Contact with Patient 03/23/22 0101   ?  (approximate) ? ? ?History  ? ?Leg Swelling ? ? ?HPI ? ?Melissa Anthony is a 88 y.o. female who presents to the ED from home with a chief complaint of left leg redness and swelling x3 days.  Patient with a history of dementia who denies recent injury to the affected leg.  Endorses chills without fever.  Denies cough, chest pain, shortness of breath, abdominal pain, nausea, vomiting or diarrhea. ?  ? ? ?Past Medical History  ? ?Past Medical History:  ?Diagnosis Date  ? Anxiety   ? Arthritis   ? Depression   ? GERD (gastroesophageal reflux disease)   ? ? ? ?Active Problem List  ? ?Patient Active Problem List  ? Diagnosis Date Noted  ? CAP (community acquired pneumonia) 03/24/2018  ? ? ? ?Past Surgical History  ? ?Past Surgical History:  ?Procedure Laterality Date  ? 23 GAUGE PARS PLANA VITRECTOMY WITH 23 GAUGE MVR PORT    ? 23 GAUGE PARS PLANA VITRECTOMY WITH 23 GAUGE MVR PORT    ? 23 GAUGE PARS PLANA VITRECTOMY WITH 23 GAUGE MVR PORT    ? 23 GAUGE PARS PLANA VITRECTOMY WITH 23 GAUGE MVR PORT    ? 23 GAUGE PARS PLANA VITRECTOMY WITH 23 GAUGE MVR PORT    ? 23 GAUGE PARS PLANA VITRECTOMY WITH 23 GAUGE MVR PORT    ? 23 GAUGE PARS PLANA VITRECTOMY WITH 23 GAUGE MVR PORT    ? 23 GAUGE PARS PLANA VITRECTOMY WITH 23 GAUGE MVR PORT    ? 23 GAUGE PARS PLANA VITRECTOMY WITH 23 GAUGE MVR PORT    ? 23 GAUGE PARS PLANA VITRECTOMY WITH 23 GAUGE MVR PORT    ? 23 GAUGE PARS PLANA VITRECTOMY WITH 23 GAUGE MVR PORT    ? 23 GAUGE PARS PLANA VITRECTOMY WITH 23 GAUGE MVR PORT    ? 23 GAUGE PARS PLANA VITRECTOMY WITH 23 GAUGE MVR PORT    ? 23 GAUGE PARS PLANA VITRECTOMY WITH 23 GAUGE MVR PORT    ? 23 GAUGE PARS PLANA VITRECTOMY WITH 23 GAUGE MVR PORT    ? 23 GAUGE PARS PLANA VITRECTOMY WITH 23 GAUGE MVR PORT    ? 23 GAUGE PARS PLANA VITRECTOMY WITH 23 GAUGE MVR PORT    ? 23 GAUGE PARS PLANA VITRECTOMY WITH  23 GAUGE MVR PORT    ? 23 GAUGE PARS PLANA VITRECTOMY WITH 23 GAUGE MVR PORT    ? 23 GAUGE PARS PLANA VITRECTOMY WITH 23 GAUGE MVR PORT    ? 23 GAUGE PARS PLANA VITRECTOMY WITH 23 GAUGE MVR PORT    ? 23 GAUGE PARS PLANA VITRECTOMY WITH 23 GAUGE MVR PORT    ? 23 GAUGE PARS PLANA VITRECTOMY WITH 23 GAUGE MVR PORT    ? 23 GAUGE PARS PLANA VITRECTOMY WITH 23 GAUGE MVR PORT    ? 23 GAUGE PARS PLANA VITRECTOMY WITH 23 GAUGE MVR PORT    ? 23 GAUGE PARS PLANA VITRECTOMY WITH 23 GAUGE MVR PORT    ? 23 GAUGE PARS PLANA VITRECTOMY WITH 23 GAUGE MVR PORT    ? 23 GAUGE PARS PLANA VITRECTOMY WITH 23 GAUGE MVR PORT    ? 23 GAUGE PARS PLANA VITRECTOMY WITH 23 GAUGE MVR PORT    ? 23 GAUGE PARS PLANA VITRECTOMY WITH 23 GAUGE MVR PORT    ? 23 GAUGE PARS  PLANA VITRECTOMY WITH 23 GAUGE MVR PORT    ? 23 GAUGE PARS PLANA VITRECTOMY WITH 23 GAUGE MVR PORT    ? 23 GAUGE PARS PLANA VITRECTOMY WITH 23 GAUGE MVR PORT    ? 23 GAUGE PARS PLANA VITRECTOMY WITH 23 GAUGE MVR PORT    ? 23 GAUGE PARS PLANA VITRECTOMY WITH 23 GAUGE MVR PORT    ? 23 GAUGE PARS PLANA VITRECTOMY WITH 23 GAUGE MVR PORT    ? 23 GAUGE PARS PLANA VITRECTOMY WITH 23 GAUGE MVR PORT    ? 23 GAUGE PARS PLANA VITRECTOMY WITH 23 GAUGE MVR PORT    ? 23 GAUGE PARS PLANA VITRECTOMY WITH 23 GAUGE MVR PORT    ? 23 GAUGE PARS PLANA VITRECTOMY WITH 23 GAUGE MVR PORT    ? 23 GAUGE PARS PLANA VITRECTOMY WITH 23 GAUGE MVR PORT    ? 23 GAUGE PARS PLANA VITRECTOMY WITH 23 GAUGE MVR PORT    ? 23 GAUGE PARS PLANA VITRECTOMY WITH 23 GAUGE MVR PORT    ? 23 GAUGE PARS PLANA VITRECTOMY WITH 23 GAUGE MVR PORT    ? 23 GAUGE PARS PLANA VITRECTOMY WITH 23 GAUGE MVR PORT    ? 23 GAUGE PARS PLANA VITRECTOMY WITH 23 GAUGE MVR PORT    ? 23 GAUGE PARS PLANA VITRECTOMY WITH 23 GAUGE MVR PORT    ? 23 GAUGE PARS PLANA VITRECTOMY WITH 23 GAUGE MVR PORT    ? 23 GAUGE PARS PLANA VITRECTOMY WITH 23 GAUGE MVR PORT    ? 23 GAUGE PARS PLANA VITRECTOMY WITH 23 GAUGE MVR PORT    ? 23 GAUGE PARS PLANA VITRECTOMY WITH 23  GAUGE MVR PORT    ? 23 GAUGE PARS PLANA VITRECTOMY WITH 23 GAUGE MVR PORT    ? 23 GAUGE PARS PLANA VITRECTOMY WITH 23 GAUGE MVR PORT    ? 23 GAUGE PARS PLANA VITRECTOMY WITH 23 GAUGE MVR PORT    ? 23 GAUGE PARS PLANA VITRECTOMY WITH 23 GAUGE MVR PORT    ? 23 GAUGE PARS PLANA VITRECTOMY WITH 23 GAUGE MVR PORT    ? 23 GAUGE PARS PLANA VITRECTOMY WITH 23 GAUGE MVR PORT    ? 23 GAUGE PARS PLANA VITRECTOMY WITH 23 GAUGE MVR PORT    ? 23 GAUGE PARS PLANA VITRECTOMY WITH 23 GAUGE MVR PORT    ? 23 GAUGE PARS PLANA VITRECTOMY WITH 23 GAUGE MVR PORT    ? 23 GAUGE PARS PLANA VITRECTOMY WITH 23 GAUGE MVR PORT    ? 23 GAUGE PARS PLANA VITRECTOMY WITH 23 GAUGE MVR PORT    ? 23 GAUGE PARS PLANA VITRECTOMY WITH 23 GAUGE MVR PORT    ? 23 GAUGE PARS PLANA VITRECTOMY WITH 23 GAUGE MVR PORT    ? 23 GAUGE PARS PLANA VITRECTOMY WITH 23 GAUGE MVR PORT    ? 23 GAUGE PARS PLANA VITRECTOMY WITH 23 GAUGE MVR PORT    ? 23 GAUGE PARS PLANA VITRECTOMY WITH 23 GAUGE MVR PORT    ? 23 GAUGE PARS PLANA VITRECTOMY WITH 23 GAUGE MVR PORT    ? 23 GAUGE PARS PLANA VITRECTOMY WITH 23 GAUGE MVR PORT    ? 23 GAUGE PARS PLANA VITRECTOMY WITH 23 GAUGE MVR PORT    ? 23 GAUGE PARS PLANA VITRECTOMY WITH 23 GAUGE MVR PORT    ? 23 GAUGE PARS PLANA VITRECTOMY WITH 23 GAUGE MVR PORT    ? 23 GAUGE PARS PLANA VITRECTOMY WITH 23 GAUGE MVR PORT    ?  23 GAUGE PARS PLANA VITRECTOMY WITH 23 GAUGE MVR PORT    ? 23 GAUGE PARS PLANA VITRECTOMY WITH 23 GAUGE MVR PORT    ? 23 GAUGE PARS PLANA VITRECTOMY WITH 23 GAUGE MVR PORT    ? 23 GAUGE PARS PLANA VITRECTOMY WITH 23 GAUGE MVR PORT    ? 23 GAUGE PARS PLANA VITRECTOMY WITH 23 GAUGE MVR PORT    ? 23 GAUGE PARS PLANA VITRECTOMY WITH 23 GAUGE MVR PORT    ? 23 GAUGE PARS PLANA VITRECTOMY WITH 23 GAUGE MVR PORT    ? 23 GAUGE PARS PLANA VITRECTOMY WITH 23 GAUGE MVR PORT    ? 23 GAUGE PARS PLANA VITRECTOMY WITH 23 GAUGE MVR PORT    ? 23 GAUGE PARS PLANA VITRECTOMY WITH 23 GAUGE MVR PORT    ? 23 GAUGE PARS PLANA VITRECTOMY WITH 23  GAUGE MVR PORT    ? 23 GAUGE PARS PLANA VITRECTOMY WITH 23 GAUGE MVR PORT    ? 23 GAUGE PARS PLANA VITRECTOMY WITH 23 GAUGE MVR PORT    ? 23 GAUGE PARS PLANA VITRECTOMY WITH 23 GAUGE MVR PORT    ? 23 GAUGE PARS PLANA VITRECTOMY WITH 23 GAUGE MVR PORT    ? 23 GAUGE PARS PLANA VITRECTOMY WITH 23 GAUGE MVR PORT    ? ABDOMINAL HYSTERECTOMY    ? APPENDECTOMY    ? CATARACT EXTRACTION W/PHACO Left 06/16/2018  ? Procedure: CATARACT EXTRACTION PHACO AND INTRAOCULAR LENS PLACEMENT (IOC);  Surgeon: Galen Manila, MD;  Location: [facility] ORS;  Service: Ophthalmology;  Laterality: Left;  [place] 00:45.6 ?AP% 19.7 ?CDE 8.96 ?Fluid Pack lot # [id number]  ? CHOLECYSTECTOMY    ? COLON SURGERY    ? long time ago. can't remember why or what  ? COLONOSCOPY    ? EYE SURGERY Bilateral 2020  ? cataract extractions  ? JOINT REPLACEMENT Right 2010  ? TKR  ? TONSILLECTOMY    ? ? ? ?Home Medications  ? ?Prior to Admission medications   ?Medication Sig Start Date End Date Taking? Authorizing Provider  ?acetaminophen (TYLENOL) 325 MG tablet Take 650 mg by mouth every 6 (six) hours as needed for moderate pain.    [provider]  ?busPIRone (BUSPAR) 15 MG tablet Take 15 mg by mouth 2 (two) times daily.     [provider]  ?donepezil (ARICEPT) 10 MG tablet Take 10 mg by mouth daily. 12/26/21   [provider]  ?estradiol (ESTRACE) 1 MG tablet Take 1 mg by mouth daily.    [provider]  ?famotidine (PEPCID) 20 MG tablet Take 20 mg by mouth 2 (two) times daily. 08/27/19   [provider]  ?mirabegron ER (MYRBETRIQ) 50 MG TB24 tablet Take 1 tablet (50 mg total) by mouth daily. 01/07/22   Stoioff, Verna Czech, MD  ?mirtazapine (REMERON) 15 MG tablet Take 15 mg by mouth at bedtime. 01/30/22   [provider]  ?Multiple Vitamin (MULTIVITAMIN WITH MINERALS) TABS tablet Take 1 tablet by mouth daily.    [provider]  ?naproxen sodium (ALEVE) 220 MG tablet Take 220-440 mg by mouth daily as needed  (arthritis).     [provider]  ?oxyCODONE-acetaminophen (PERCOCET) 5-325 MG tablet Take 1 tablet by mouth every 4 (four) hours as needed for severe pain. 02/16/21   Minna Antis, MD  ?[facility]

## 2022-03-23 NOTE — Hospital Course (Signed)
Melissa Anthony is a 88 y.o. female with medical history significant for Depression and anxiety, dementia, GERD and urinary incontinence who presents to the ED with a complaint of left lower extremity pain and swelling that started a few days prior.  She denies fever or chills. ?She is diagnosed with left lower extremity cellulitis, was started on antibiotics with Rocephin and vancomycin. ?

## 2022-03-23 NOTE — Assessment & Plan Note (Addendum)
Reordered home medicines. ?

## 2022-03-23 NOTE — Progress Notes (Signed)
Pharmacy Antibiotic Note ? ?ARY Anthony is Anthony 88 y.o. female admitted on 03/23/2022 with cellulitis.  Pharmacy has been consulted for Vancomycin dosing. ? ?Plan: ?Scr improved 1.50>>1.21 ?Will adjust from Vancomycin 1500 mg IV Q48H to 750mg  IV q24h ?AUC = 453 ?Cmin= 13.3 ?Scr 1.21 ? ? ?Height: 5\' 5"  (165.1 cm) ?Weight: 77.1 kg (170 lb) ?IBW/kg (Calculated) : 57 ? ?Temp (24hrs), Avg:98.9 ?F (37.2 ?C), Min:98.4 ?F (36.9 ?C), Max:99.4 ?F (37.4 ?C) ? ?Recent Labs  ?Lab 03/22/22 ?2047 03/23/22 ?0453  ?WBC 24.2* 23.0*  ?CREATININE 1.50* 1.21*  ?LATICACIDVEN 1.5  --   ? ?  ?Estimated Creatinine Clearance: 34.9 mL/min (Anthony) (by C-G formula based on SCr of 1.21 mg/dL (H)).   ? ?No Known Allergies ? ?Antimicrobials this admission: ?Vanc  4/28>>  ? Ceftriaxone 4/28>>  ? ?Dose adjustments this admission: ? ? ?Microbiology results: ? 4/29 BCx: pend ? UCx:   ? Sputum:   ? MRSA PCR:  ? ?Thank you for allowing pharmacy to be Anthony part of this patient?s care. ? ?Melissa Anthony ?03/23/2022 1:02 PM ? ?

## 2022-03-24 ENCOUNTER — Inpatient Hospital Stay: Payer: Medicare Other

## 2022-03-24 DIAGNOSIS — L03116 Cellulitis of left lower limb: Secondary | ICD-10-CM | POA: Diagnosis not present

## 2022-03-24 LAB — BASIC METABOLIC PANEL
Anion gap: 8 (ref 5–15)
BUN: 22 mg/dL (ref 8–23)
CO2: 25 mmol/L (ref 22–32)
Calcium: 8.9 mg/dL (ref 8.9–10.3)
Chloride: 105 mmol/L (ref 98–111)
Creatinine, Ser: 1 mg/dL (ref 0.44–1.00)
GFR, Estimated: 55 mL/min — ABNORMAL LOW (ref >60)
Glucose, Bld: 92 mg/dL (ref 70–99)
Potassium: 3.8 mmol/L (ref 3.5–5.1)
Sodium: 138 mmol/L (ref 135–145)

## 2022-03-24 LAB — CBC
HCT: 36 % (ref 36.0–46.0)
Hemoglobin: 11.6 g/dL — ABNORMAL LOW (ref 12.0–15.0)
MCH: 30.3 pg (ref 26.0–34.0)
MCHC: 32.2 g/dL (ref 30.0–36.0)
MCV: 94 fL (ref 80.0–100.0)
Platelets: 314 10*3/uL (ref 150–400)
RBC: 3.83 MIL/uL — ABNORMAL LOW (ref 3.87–5.11)
RDW: 13.2 % (ref 11.5–15.5)
WBC: 17.6 10*3/uL — ABNORMAL HIGH (ref 4.0–10.5)
nRBC: 0 % (ref 0.0–0.2)

## 2022-03-24 LAB — MAGNESIUM: Magnesium: 2.3 mg/dL (ref 1.7–2.4)

## 2022-03-24 MED ORDER — VANCOMYCIN HCL IN DEXTROSE 1-5 GM/200ML-% IV SOLN
1000.0000 mg | INTRAVENOUS | Status: DC
  Administered 2022-03-24 – 2022-03-25 (×2): 1000 mg via INTRAVENOUS
  Filled 2022-03-24 (×2): qty 200

## 2022-03-24 NOTE — Plan of Care (Signed)
?  Problem: Clinical Measurements: ?Goal: Ability to avoid or minimize complications of infection will improve ?Outcome: Progressing ?  ?Problem: Skin Integrity: ?Goal: Skin integrity will improve ?Outcome: Progressing ?  ?

## 2022-03-24 NOTE — Progress Notes (Signed)
Pharmacy Antibiotic Note ? ?Melissa Anthony is a 88 y.o. female admitted on 03/23/2022 with cellulitis.  Pharmacy has been consulted for Vancomycin dosing. ? ?Plan: ?Scr improved 1.50>>1.21>>1.00 ?Will adjust from Vancomycin from 750mg  IV q24h to 1000mg  IV q24h ?AUC = 513 ?Cmin= 14.1 ?Scr 1.00 ? ? ?Height: 5\' 5"  (165.1 cm) ?Weight: 77.1 kg (170 lb) ?IBW/kg (Calculated) : 57 ? ?Temp (24hrs), Avg:98.9 ?F (37.2 ?C), Min:97.8 ?F (36.6 ?C), Max:99.3 ?F (37.4 ?C) ? ?Recent Labs  ?Lab 03/22/22 ?2047 03/23/22 ?03/24/22 03/24/22 ?0415  ?WBC 24.2* 23.0* 17.6*  ?CREATININE 1.50* 1.21* 1.00  ?LATICACIDVEN 1.5  --   --   ? ?  ?Estimated Creatinine Clearance: 42.2 mL/min (by C-G formula based on SCr of 1 mg/dL).   ? ?No Known Allergies ? ?Antimicrobials this admission: ?Vanc  4/28>>  ? Ceftriaxone 4/28>>  ? ?Dose adjustments this admission: ?4/30: Vanc 750mg  IV q24h to 1000mg  IV q24h ? ? ?Microbiology results: ? 4/29 03/26/22 ? UCx:   ? Sputum:   ? MRSA PCR:  ? ?Thank you for allowing pharmacy to be a part of this patient?s care. ? ?Launa Goedken A ?03/24/2022 11:09 AM ? ?

## 2022-03-24 NOTE — Evaluation (Signed)
Physical Therapy Evaluation ?Patient Details ?Name: Melissa Anthony ?MRN: [id number] ?DOB: 1934/11/03 ?Today's Date: 03/24/2022 ? ?History of Present Illness ? Melissa Anthony is an 87yoF who [facility] on [facility] c leg edema. Pt admitted with LLE cellulitis. PMH: GAD, depression GERD.  ?Clinical Impression ? Pt admitted c above Dx. Pt shows functional limitations due to the deficits listed below (see "PT Problem List"). Patient agreeable to PT evaluation. PLOF and home setup obtained. Pt able to perform bed mobility, transfers, and AMB with greater than typical effort due to ongoing severe pain issues, however pt moves remarkably well and with a RW can tolerate limited community distances very well. Patient is at near baseline level in strength and balance, all education completed, and time is given to address all questions/concerns. No additional skilled PT services needed at this time, PT signing off. PT recommends daily ambulation ad lib or with nursing staff as needed to prevent deconditioning.  ? ? ?   ? ?Recommendations for follow up therapy are one component of a multi-disciplinary discharge planning process, led by the attending physician.  Recommendations may be updated based on patient status, additional functional criteria and insurance authorization. ? ?Follow Up Recommendations No PT follow up ? ?  ?Assistance Recommended at Discharge Set up Supervision/Assistance  ?Patient can return home with the following ? Assist for transportation;Assistance with cooking/housework;A little help with bathing/dressing/bathroom ? ?  ?Equipment Recommendations None recommended by PT  ?Recommendations for Other Services ?    ?  ?Functional Status Assessment Patient has had a recent decline in their functional status and demonstrates the ability to make significant improvements in function in a reasonable and predictable amount of time.  ? ?  ?Precautions / Restrictions Precautions ?Precautions: Fall ?Restrictions ?Weight  Bearing Restrictions: No  ? ?  ? ?Mobility ? Bed Mobility ?Overal bed mobility: Independent ?  ?  ?  ?  ?  ?  ?  ?  ? ?Transfers ?Overall transfer level: Independent ?  ?  ?  ?  ?  ?  ?  ?  ?  ?  ? ?Ambulation/Gait ?Ambulation/Gait assistance: Modified independent (Device/Increase time) ?Gait Distance (Feet): 200 Feet ?Assistive device: Rolling walker (2 wheels) ?Gait Pattern/deviations: WFL(Within Functional Limits) ?  ?  ?  ?General Gait Details: LLE veyr erythmatous and painful, but RW allows for better pain control in AMB and improved AMB tolerance ? ?Stairs ?  ?  ?  ?  ?  ? ?Wheelchair Mobility ?  ? ?Modified Rankin (Stroke Patients Only) ?  ? ?  ? ?Balance   ?  ?  ?  ?  ?  ?  ?  ?  ?  ?  ?  ?  ?  ?  ?  ?  ?  ?  ?   ? ? ? ?Pertinent Vitals/Pain Pain Assessment ?Pain Assessment: Faces (unable to rate c NPRS) ?Faces Pain Scale: Hurts little more ?Pain Location: LLE  ? ? ?Home Living Family/patient expects to be discharged to:: Private residence ?Living Arrangements: Alone (Brother nearby) ?Available Help at Discharge: Family ?Type of Home: House ?Home Access: Stairs to enter ?  ?Entrance Stairs-Number of Steps: 1 ?  ?Home Layout: One level ?Home Equipment: Agricultural consultant (2 wheels);Wheelchair - manual ?   ?  ?Prior Function Prior Level of Function : Independent/Modified Independent ?  ?  ?  ?  ?  ?  ?  ?  ?  ? ? ?Hand Dominance  ?   ? ?  ?  Extremity/Trunk Assessment  ?   ?  ? ?  ?  ? ?   ?Communication  ?    ?Cognition Arousal/Alertness: Awake/alert ?Behavior During Therapy: [facility] for tasks assessed/performed ?Overall Cognitive Status: Within Functional Limits for tasks assessed ?  ?  ?  ?  ?  ?  ?  ?  ?  ?  ?  ?  ?  ?  ?  ?  ?  ?  ?  ? ?  ?General Comments   ? ?  ?Exercises    ? ?Assessment/Plan  ?  ?PT Assessment Patient does not need any further PT services  ?PT Problem List Decreased activity tolerance ? ?   ?  ?PT Treatment Interventions Functional mobility training;Therapeutic activities   ? ?PT Goals (Current  goals can be found in the Care Plan section)  ?Acute Rehab PT Goals ?PT Goal Formulation: All assessment and education complete, DC therapy ? ?  ?Frequency   ?  ? ? ?Co-evaluation   ?  ?  ?  ?  ? ? ?  ?AM-PAC PT "6 Clicks" Mobility  ?Outcome Measure Help needed turning from your back to your side while in a flat bed without using bedrails?: None ?Help needed moving from lying on your back to sitting on the side of a flat bed without using bedrails?: None ?Help needed moving to and from a bed to a chair (including a wheelchair)?: None ?Help needed standing up from a chair using your arms (e.g., wheelchair or bedside chair)?: None ?Help needed to walk in hospital room?: A Little ?Help needed climbing 3-5 steps with a railing? : A Little ?6 Click Score: 22 ? ?  ?End of Session Equipment Utilized During Treatment: Gait belt ?Activity Tolerance: Patient tolerated treatment well;No increased pain;Patient limited by pain ?Patient left: in chair;with nursing/sitter in room;with call bell/phone within reach ?Nurse Communication: Mobility status ?PT Visit Diagnosis: Difficulty in walking, not elsewhere classified (R26.2) ?  ? ?Time: 2993-7169 ?PT Time Calculation (min) (ACUTE ONLY): 19 min ? ? ?Charges:   PT Evaluation ?$PT Eval Low Complexity: 1 Low ?  ?  ?   ?3:30 PM, 03/24/22 ?Rosamaria Lints, PT, DPT ?Physical Therapist - [place] ?[facility]  ?[phone] (ASCOM)  ? ? ?[name] ?03/24/2022, 3:28 PM ? ?

## 2022-03-24 NOTE — Progress Notes (Signed)
?PROGRESS NOTE ? ? ? ?Melissa Anthony  KXF:[id number] DOB: [birth year] DOA: 03/23/2022 ?PCP: Lauro Regulus, MD  ? ? ?Brief Narrative:  ?Melissa Anthony is a 88 y.o. female with medical history significant for Depression and anxiety, dementia, GERD and urinary incontinence who presents to the ED with a complaint of left lower extremity pain and swelling . Admitted for LLE cellulitis ? ? ? ?Consultants:  ? ? ?Procedures:  ? ?Antimicrobials:  ? Rocephin and vancomycin ? ? ?Subjective: ?C/o pain of LLE . No sob.no cp. No other complaints. ? ?Objective: ?Vitals:  ? 03/23/22 1957 03/24/22 0009 03/24/22 0531 03/24/22 6967  ?BP: (!) 132/91 (!) 157/64 (!) 162/76 (!) 145/65  ?Pulse: 87 87 84 83  ?Resp: 18 18 16 14   ?Temp: 98.9 ?F (37.2 ?C) 99.3 ?F (37.4 ?C) 99.2 ?F (37.3 ?C) 97.8 ?F (36.6 ?C)  ?TempSrc: Oral   Oral  ?SpO2: 97% 96% 97% 96%  ?Weight:      ?Height:      ? ?No intake or output data in the 24 hours ending 03/24/22 1242 ?Filed Weights  ? 03/22/22 2043  ?Weight: 77.1 kg  ? ? ?Examination: ?Calm, NAD ?Cta no w/r ?Reg s1/s2 no gallop ?Soft benign +bs ?LLE swelling, tightness. Mild TTP especially around calf. Erythema and warmth ?Aaoxox3  ?Mood and affect appropriate in current setting  ? ? ? ?Data Reviewed: I have personally reviewed following labs and imaging studies ? ?CBC: ?Recent Labs  ?Lab 03/22/22 ?2047 03/23/22 ?03/25/22 03/24/22 ?0415  ?WBC 24.2* 23.0* 17.6*  ?NEUTROABS 20.8*  --   --   ?HGB 12.7 11.6* 11.6*  ?HCT 38.9 35.2* 36.0  ?MCV 93.7 94.6 94.0  ?PLT 311 275 314  ? ?Basic Metabolic Panel: ?Recent Labs  ?Lab 03/22/22 ?2047 03/23/22 ?03/25/22 03/24/22 ?0415  ?NA 136 135 138  ?K 4.0 4.1 3.8  ?CL 97* 101 105  ?CO2 27 24 25   ?GLUCOSE 115* 94 92  ?BUN 25* 27* 22  ?CREATININE 1.50* 1.21* 1.00  ?CALCIUM 9.7 8.8* 8.9  ?MG  --   --  2.3  ? ?GFR: ?Estimated Creatinine Clearance: 42.2 mL/min (by C-G formula based on SCr of 1 mg/dL). ?Liver Function Tests: ?Recent Labs  ?Lab 03/22/22 ?2047  ?AST 23  ?ALT 16  ?ALKPHOS 86   ?BILITOT 1.4*  ?PROT 8.8*  ?ALBUMIN 4.0  ? ?No results for input(s): LIPASE, AMYLASE in the last 168 hours. ?No results for input(s): AMMONIA in the last 168 hours. ?Coagulation Profile: ?No results for input(s): INR, PROTIME in the last 168 hours. ?Cardiac Enzymes: ?No results for input(s): CKTOTAL, CKMB, CKMBINDEX, TROPONINI in the last 168 hours. ?BNP (last 3 results) ?No results for input(s): PROBNP in the last 8760 hours. ?HbA1C: ?No results for input(s): HGBA1C in the last 72 hours. ?CBG: ?No results for input(s): GLUCAP in the last 168 hours. ?Lipid Profile: ?No results for input(s): CHOL, HDL, LDLCALC, TRIG, CHOLHDL, LDLDIRECT in the last 72 hours. ?Thyroid Function Tests: ?No results for input(s): TSH, T4TOTAL, FREET4, T3FREE, THYROIDAB in the last 72 hours. ?Anemia Panel: ?No results for input(s): VITAMINB12, FOLATE, FERRITIN, TIBC, IRON, RETICCTPCT in the last 72 hours. ?Sepsis Labs: ?Recent Labs  ?Lab 03/22/22 ?2047  ?PROCALCITON 3.37  ?LATICACIDVEN 1.5  ? ? ?Recent Results (from the past 240 hour(s))  ?Blood culture (routine x 2)     Status: None (Preliminary result)  ? Collection Time: 03/22/22  8:47 PM  ? Specimen: BLOOD  ?Result Value Ref Range Status  ? Specimen  Description BLOOD RIGHT ANTECUBITAL  Final  ? Special Requests   Final  ?  BOTTLES DRAWN AEROBIC AND ANAEROBIC Blood Culture adequate volume  ? Culture   Final  ?  NO GROWTH 2 DAYS ?Performed at [facility], [address], [place], [place] [address] ?  ? Report Status PENDING  Incomplete  ?Blood culture (routine x 2)     Status: None (Preliminary result)  ? Collection Time: 03/23/22  2:06 AM  ? Specimen: BLOOD  ?Result Value Ref Range Status  ? Specimen Description BLOOD LEFT ANTECUBITAL  Final  ? Special Requests   Final  ?  BOTTLES DRAWN AEROBIC AND ANAEROBIC Blood Culture results may not be optimal due to an inadequate volume of blood received in culture bottles  ? Culture   Final  ?  NO GROWTH 1 DAY ?Performed at [facility], [address] [place], [place] [address] ?  ? Report Status PENDING  Incomplete  ?  ? ? ? ? ? ?Radiology Studies: ?DG Chest 2 View ? ?Result Date: 03/22/2022 ?CLINICAL DATA:  Weakness EXAM: CHEST - 2 VIEW COMPARISON:  03/04/2021 FINDINGS: Cardiac shadow is within normal limits. Lungs are well aerated bilaterally. No focal infiltrate or effusion is seen. No acute bony abnormality is noted. IMPRESSION: No active cardiopulmonary disease. Electronically Signed   By: Alcide Clever M.D.   On: 03/22/2022 21:12   ? ? ? ? ? ?Scheduled Meds: ? busPIRone  15 mg Oral BID  ? donepezil  10 mg Oral Daily  ? enoxaparin (LOVENOX) injection  40 mg Subcutaneous Daily  ? mirabegron ER  50 mg Oral Daily  ? mirtazapine  15 mg Oral QHS  ? sertraline  100 mg Oral Daily  ? ?Continuous Infusions: ? cefTRIAXone (ROCEPHIN)  IV 2 g (03/24/22 0223)  ? vancomycin    ? ? ?Assessment & Plan: ?  ?Principal Problem: ?  Cellulitis of left lower extremity ?Active Problems: ?  AKI (acute kidney injury) (HCC) ?  GERD (gastroesophageal reflux disease) ?  Depression ?  Urinary incontinence ?  Dementia without behavioral disturbance (HCC) ? ? ?Cellulitis of left lower extremity ?Legs elevated ?WBC coming down ?Continue Rocephin and vancomycin ?Still quite symptomatic, will also obtain venous ultrasound to rule out DVT as she is complaining of calf pain ?  ?AKI (acute kidney injury) (HCC) ?Improved with IV fluids ?  ?Dementia without behavioral disturbance (HCC) ?Continue Remeron and Aricept  ? ?  ?Depression ?Continue Remeron, Zoloft  ? ?GERD (gastroesophageal reflux disease) ?Continue famotidine ? ? ?DVT prophylaxis: Lovenox ?Code Status: ?Family Communication: None at bedside ?Disposition Plan:  ?Status is: Inpatient ?Remains inpatient appropriate because: IV antibiotics.  Ruling out DVT. ?  ? ? ? ? ? LOS: 1 day  ? ?Time spent: 35 min with >50% on coc ? ? ? ?Lynn Ito, MD ?[facility] ?Pager [phone] ? ?If 7PM-7AM, please  contact night-coverage ?03/24/2022, 12:42 PM   ?

## 2022-03-24 NOTE — TOC CM/SW Note (Signed)
?  Transition of Care (TOC) Screening Note ? ? ?Patient Details  ?Name: Melissa Anthony ?Date of Birth: [birth year] ? ? ?Transition of Care (TOC) CM/SW Contact:    ?Isabeau Mccalla E Arissa Fagin, LCSW ?Phone Number: ?03/24/2022, 11:50 AM ? ? ? ?Transition of Care Department [facility]) has reviewed patient and no TOC needs have been identified at this time. We will continue to monitor patient advancement through interdisciplinary progression rounds. If new patient transition needs arise, please place a TOC consult. ? ? ?

## 2022-03-25 DIAGNOSIS — L03116 Cellulitis of left lower limb: Secondary | ICD-10-CM

## 2022-03-25 LAB — CBC
HCT: 34.5 % — ABNORMAL LOW (ref 36.0–46.0)
Hemoglobin: 11 g/dL — ABNORMAL LOW (ref 12.0–15.0)
MCH: 30.1 pg (ref 26.0–34.0)
MCHC: 31.9 g/dL (ref 30.0–36.0)
MCV: 94.5 fL (ref 80.0–100.0)
Platelets: 320 10*3/uL (ref 150–400)
RBC: 3.65 MIL/uL — ABNORMAL LOW (ref 3.87–5.11)
RDW: 13.2 % (ref 11.5–15.5)
WBC: 12.2 10*3/uL — ABNORMAL HIGH (ref 4.0–10.5)
nRBC: 0 % (ref 0.0–0.2)

## 2022-03-25 LAB — CREATININE, SERUM
Creatinine, Ser: 0.94 mg/dL (ref 0.44–1.00)
GFR, Estimated: 59 mL/min — ABNORMAL LOW (ref >60)

## 2022-03-25 MED ORDER — CEFAZOLIN SODIUM-DEXTROSE 1-4 GM/50ML-% IV SOLN
1.0000 g | Freq: Three times a day (TID) | INTRAVENOUS | Status: DC
  Administered 2022-03-26 – 2022-03-27 (×4): 1 g via INTRAVENOUS
  Filled 2022-03-25 (×6): qty 50

## 2022-03-25 NOTE — Progress Notes (Signed)
Mobility Specialist - Progress Note ? ? ? 03/25/22 1500  ?Mobility  ?Activity Ambulated independently in hallway;Stood at bedside  ?Level of Assistance Modified independent, requires aide device or extra time  ?Musician;Four wheel walker  ?Distance Ambulated (ft) 600 ft  ?Activity Response Tolerated well  ?$Mobility charge 1 Mobility  ? ? ? ?Pt standing upon arrival using RA. Completes ambulation with ModI voicing no complaints and returns to recliner with needs in reach and chair alarm set. ? ?Melissa Anthony ?Mobility Specialist ?03/25/22, 3:17 PM ? ? ? ? ?

## 2022-03-25 NOTE — TOC Progression Note (Signed)
Transition of Care (TOC) - Progression Note  ? ? ?Patient Details  ?Name: Melissa Anthony ?MRN: [id number] ?Date of Birth: 05-20-1934 ? ?Transition of Care (TOC) CM/SW Contact  ?Caryn Section, RN ?Phone Number: ?03/25/2022, 4:10 PM ? ?Clinical Narrative:   Anticipated discharge tomorrow.  Patient may need IV antibiotics, awaiting ID consult.  Jeri Modena aware.  TOC to follow. ? ? ? ?  ?  ? ?Expected Discharge Plan and Services ?  ?  ?  ?  ?  ?                ?  ?  ?  ?  ?  ?  ?  ?  ?  ?  ? ? ?Social Determinants of Health (SDOH) Interventions ?  ? ?Readmission Risk Interventions ?   ? View : No data to display.  ?  ?  ?  ? ? ?

## 2022-03-25 NOTE — Consult Note (Signed)
NAME: NYKAYLA MALICK  ?DOB: 1934-11-14  ?MRN: [id number]  ?Date/Time: 03/25/2022 3:08 PM ? ?REQUESTING PROVIDER Dr. Kurtis Bushman ?Subjective:  ?REASON FOR CONSULT: Left leg cellulitis ?? ?JUANICE SEELINGER is a 88 y.o. female with a history of right total knee replacement, hyperlipidemia, osteoarthritis, depression as per medical records presents with left leg swelling and pain for few days duration. ?Patient states she is always working in the yard.  It looks like she sustained some scratching of the right leg from shrubs.  If she then noticed some scabbing in the right leg but also erythema of the left leg.  As it got worse she came to the ED.  She denies any fever. ?She did not take any antibiotics recently ?She states she lives on her own.  Her husband passed away 2 years ago.  When I asked her whether she has depression she denied.  She said she loves people and always  outside enjoying nature.  She used to play golf but not in the last few years. ?In the ED BP 140/90, temperature 99, pulse 96 and sats 98%.  WBC was 24.2, Hb 12.7, platelet 311 and creatinine 1.50.  Blood cultures were sent.  She was started on vancomycin and ceftriaxone. ?I am asked to see the patient for further recommendation of antibiotic. ? ?Past Medical History:  ?Diagnosis Date  ? Anxiety   ? Arthritis   ? Depression   ? GERD (gastroesophageal reflux disease)   ?  ?Past Surgical History:  ?Procedure Laterality Date  ? 69 GAUGE PARS PLANA VITRECTOMY WITH 23 GAUGE MVR PORT    ? 23 GAUGE PARS PLANA VITRECTOMY WITH 23 GAUGE MVR PORT    ? 23 GAUGE PARS PLANA VITRECTOMY WITH 23 GAUGE MVR PORT    ? 23 GAUGE PARS PLANA VITRECTOMY WITH 23 GAUGE MVR PORT    ? 23 GAUGE PARS PLANA VITRECTOMY WITH 23 GAUGE MVR PORT    ? 23 GAUGE PARS PLANA VITRECTOMY WITH 23 GAUGE MVR PORT    ? 23 GAUGE PARS PLANA VITRECTOMY WITH 23 GAUGE MVR PORT    ? 23 GAUGE PARS PLANA VITRECTOMY WITH 23 GAUGE MVR PORT    ? 23 GAUGE PARS PLANA VITRECTOMY WITH 23 GAUGE MVR PORT    ? 23 GAUGE  PARS PLANA VITRECTOMY WITH 23 GAUGE MVR PORT    ? 23 GAUGE PARS PLANA VITRECTOMY WITH 23 GAUGE MVR PORT    ? 23 GAUGE PARS PLANA VITRECTOMY WITH 23 GAUGE MVR PORT    ? 23 GAUGE PARS PLANA VITRECTOMY WITH 23 GAUGE MVR PORT    ? 23 GAUGE PARS PLANA VITRECTOMY WITH 23 GAUGE MVR PORT    ? 23 GAUGE PARS PLANA VITRECTOMY WITH 23 GAUGE MVR PORT    ? 23 GAUGE PARS PLANA VITRECTOMY WITH 23 GAUGE MVR PORT    ? 23 GAUGE PARS PLANA VITRECTOMY WITH 23 GAUGE MVR PORT    ? 23 GAUGE PARS PLANA VITRECTOMY WITH 23 GAUGE MVR PORT    ? 23 GAUGE PARS PLANA VITRECTOMY WITH 23 GAUGE MVR PORT    ? 23 GAUGE PARS PLANA VITRECTOMY WITH 23 GAUGE MVR PORT    ? 23 GAUGE PARS PLANA VITRECTOMY WITH 23 GAUGE MVR PORT    ? 23 GAUGE PARS PLANA VITRECTOMY WITH 23 GAUGE MVR PORT    ? 23 GAUGE PARS PLANA VITRECTOMY WITH 23 GAUGE MVR PORT    ? 23 GAUGE PARS PLANA VITRECTOMY WITH 23 GAUGE MVR PORT    ?  [place] VITRECTOMY WITH 23 GAUGE MVR PORT    ? 23 GAUGE PARS PLANA VITRECTOMY WITH 23 GAUGE MVR PORT    ? 23 GAUGE PARS PLANA VITRECTOMY WITH 23 GAUGE MVR PORT    ? 23 GAUGE PARS PLANA VITRECTOMY WITH 23 GAUGE MVR PORT    ? 23 GAUGE PARS PLANA VITRECTOMY WITH 23 GAUGE MVR PORT    ? 23 GAUGE PARS PLANA VITRECTOMY WITH 23 GAUGE MVR PORT    ? 23 GAUGE PARS PLANA VITRECTOMY WITH 23 GAUGE MVR PORT    ? 23 GAUGE PARS PLANA VITRECTOMY WITH 23 GAUGE MVR PORT    ? 23 GAUGE PARS PLANA VITRECTOMY WITH 23 GAUGE MVR PORT    ? 23 GAUGE PARS PLANA VITRECTOMY WITH 23 GAUGE MVR PORT    ? 23 GAUGE PARS PLANA VITRECTOMY WITH 23 GAUGE MVR PORT    ? 23 GAUGE PARS PLANA VITRECTOMY WITH 23 GAUGE MVR PORT    ? 23 GAUGE PARS PLANA VITRECTOMY WITH 23 GAUGE MVR PORT    ? 23 GAUGE PARS PLANA VITRECTOMY WITH 23 GAUGE MVR PORT    ? 23 GAUGE PARS PLANA VITRECTOMY WITH 23 GAUGE MVR PORT    ? 23 GAUGE PARS PLANA VITRECTOMY WITH 23 GAUGE MVR PORT    ? 23 GAUGE PARS PLANA VITRECTOMY WITH 23 GAUGE MVR PORT    ? 23 GAUGE PARS PLANA VITRECTOMY WITH 23 GAUGE MVR PORT    ? 23 GAUGE  PARS PLANA VITRECTOMY WITH 23 GAUGE MVR PORT    ? 23 GAUGE PARS PLANA VITRECTOMY WITH 23 GAUGE MVR PORT    ? 23 GAUGE PARS PLANA VITRECTOMY WITH 23 GAUGE MVR PORT    ? 23 GAUGE PARS PLANA VITRECTOMY WITH 23 GAUGE MVR PORT    ? 23 GAUGE PARS PLANA VITRECTOMY WITH 23 GAUGE MVR PORT    ? 23 GAUGE PARS PLANA VITRECTOMY WITH 23 GAUGE MVR PORT    ? 23 GAUGE PARS PLANA VITRECTOMY WITH 23 GAUGE MVR PORT    ? 23 GAUGE PARS PLANA VITRECTOMY WITH 23 GAUGE MVR PORT    ? 23 GAUGE PARS PLANA VITRECTOMY WITH 23 GAUGE MVR PORT    ? 23 GAUGE PARS PLANA VITRECTOMY WITH 23 GAUGE MVR PORT    ? 23 GAUGE PARS PLANA VITRECTOMY WITH 23 GAUGE MVR PORT    ? 23 GAUGE PARS PLANA VITRECTOMY WITH 23 GAUGE MVR PORT    ? 23 GAUGE PARS PLANA VITRECTOMY WITH 23 GAUGE MVR PORT    ? 23 GAUGE PARS PLANA VITRECTOMY WITH 23 GAUGE MVR PORT    ? 23 GAUGE PARS PLANA VITRECTOMY WITH 23 GAUGE MVR PORT    ? 23 GAUGE PARS PLANA VITRECTOMY WITH 23 GAUGE MVR PORT    ? 23 GAUGE PARS PLANA VITRECTOMY WITH 23 GAUGE MVR PORT    ? 23 GAUGE PARS PLANA VITRECTOMY WITH 23 GAUGE MVR PORT    ? 23 GAUGE PARS PLANA VITRECTOMY WITH 23 GAUGE MVR PORT    ? 23 GAUGE PARS PLANA VITRECTOMY WITH 23 GAUGE MVR PORT    ? 23 GAUGE PARS PLANA VITRECTOMY WITH 23 GAUGE MVR PORT    ? 23 GAUGE PARS PLANA VITRECTOMY WITH 23 GAUGE MVR PORT    ? 23 GAUGE PARS PLANA VITRECTOMY WITH 23 GAUGE MVR PORT    ? 23 GAUGE PARS PLANA VITRECTOMY WITH 23 GAUGE MVR PORT    ? 23 GAUGE PARS PLANA VITRECTOMY WITH 23 GAUGE MVR PORT    ?  [place] VITRECTOMY WITH 23 GAUGE MVR PORT    ? 23 GAUGE PARS PLANA VITRECTOMY WITH 23 GAUGE MVR PORT    ? 23 GAUGE PARS PLANA VITRECTOMY WITH 23 GAUGE MVR PORT    ? 23 GAUGE PARS PLANA VITRECTOMY WITH 23 GAUGE MVR PORT    ? 23 GAUGE PARS PLANA VITRECTOMY WITH 23 GAUGE MVR PORT    ? 23 GAUGE PARS PLANA VITRECTOMY WITH 23 GAUGE MVR PORT    ? 23 GAUGE PARS PLANA VITRECTOMY WITH 23 GAUGE MVR PORT    ? 23 GAUGE PARS PLANA VITRECTOMY WITH 23 GAUGE MVR PORT    ? 23 GAUGE  PARS PLANA VITRECTOMY WITH 23 GAUGE MVR PORT    ? 23 GAUGE PARS PLANA VITRECTOMY WITH 23 GAUGE MVR PORT    ? 23 GAUGE PARS PLANA VITRECTOMY WITH 23 GAUGE MVR PORT    ? 23 GAUGE PARS PLANA VITRECTOMY WITH 23 GAUGE MVR PORT    ? 23 GAUGE PARS PLANA VITRECTOMY WITH 23 GAUGE MVR PORT    ? 23 GAUGE PARS PLANA VITRECTOMY WITH 23 GAUGE MVR PORT    ? 23 GAUGE PARS PLANA VITRECTOMY WITH 23 GAUGE MVR PORT    ? 23 GAUGE PARS PLANA VITRECTOMY WITH 23 GAUGE MVR PORT    ? 23 GAUGE PARS PLANA VITRECTOMY WITH 23 GAUGE MVR PORT    ? 23 GAUGE PARS PLANA VITRECTOMY WITH 23 GAUGE MVR PORT    ? 23 GAUGE PARS PLANA VITRECTOMY WITH 23 GAUGE MVR PORT    ? 23 GAUGE PARS PLANA VITRECTOMY WITH 23 GAUGE MVR PORT    ? 23 GAUGE PARS PLANA VITRECTOMY WITH 23 GAUGE MVR PORT    ? 23 GAUGE PARS PLANA VITRECTOMY WITH 23 GAUGE MVR PORT    ? ABDOMINAL HYSTERECTOMY    ? APPENDECTOMY    ? CATARACT EXTRACTION W/PHACO Left 06/16/2018  ? Procedure: CATARACT EXTRACTION PHACO AND INTRAOCULAR LENS PLACEMENT (IOC);  Surgeon: Birder Robson, MD;  Location: [facility] ORS;  Service: Ophthalmology;  Laterality: Left;  [place] 00:45.6 ?AP% 19.7 ?CDE 8.96 ?Fluid Pack lot # [id number]  ? CHOLECYSTECTOMY    ? COLON SURGERY    ? long time ago. can't remember why or what  ? COLONOSCOPY    ? EYE SURGERY Bilateral 2020  ? cataract extractions  ? JOINT REPLACEMENT Right 2010  ? TKR  ? TONSILLECTOMY    ?  ?Social History  ? ?Socioeconomic History  ? Marital status: Widowed  ?  Spouse name: Not on file  ? Number of children: 1  ? Years of education: Not on file  ? Highest education level: Not on file  ?Occupational History  ? Occupation: Scientist, product/process development for [employer]  ?  Comment: retired  ?Tobacco Use  ? Smoking status: Former  ?  Types: Cigarettes  ?  Quit date: 73  ?  Years since quitting: 43.3  ? Smokeless tobacco: Never  ?Vaping Use  ? Vaping Use: Never used  ?Substance and Sexual Activity  ? Alcohol use: Never  ? Drug use: Never  ? Sexual activity: Yes  ?Other Topics Concern  ? Not  on file  ?Social History Narrative  ? Lives with husband, gardens daily.  ? ?Social Determinants of Health  ? ?Financial Resource Strain: Not on file  ?Food Insecurity: Not on file  ?Transportation Needs: N

## 2022-03-25 NOTE — Progress Notes (Signed)
?PROGRESS NOTE ? ? ? ?Melissa Anthony  WNU:[id number] DOB: February 10, 1934 DOA: 03/23/2022 ?PCP: Lauro Regulus, MD  ? ? ?Brief Narrative:  ?Melissa Anthony is a 88 y.o. female with medical history significant for Depression and anxiety, dementia, GERD and urinary incontinence who presents to the ED with a complaint of left lower extremity pain and swelling . Admitted for LLE cellulitis ? ?5/1 swelling down from leg elevation. Erythema not much difference. ? ?Consultants:  ? ? ?Procedures:  ? ?Antimicrobials:  ? Rocephin and vancomycin ? ? ?Subjective: ?No sob, no cp.  ? ?Objective: ?Vitals:  ? 03/24/22 1926 03/25/22 0110 03/25/22 0421 03/25/22 0756  ?BP: (!) 134/58 128/61 (!) 141/75 (!) 138/59  ?Pulse: 72 70 82 65  ?Resp: 18 18 18 20   ?Temp: 98.9 ?F (37.2 ?C) 98.9 ?F (37.2 ?C) 98.3 ?F (36.8 ?C) 98.3 ?F (36.8 ?C)  ?TempSrc:  Oral    ?SpO2: 99% 99% 98% 100%  ?Weight:      ?Height:      ? ? ?Intake/Output Summary (Last 24 hours) at 03/25/2022 1215 ?Last data filed at 03/25/2022 1018 ?Gross per 24 hour  ?Intake 860.06 ml  ?Output --  ?Net 860.06 ml  ? ?Filed Weights  ? 03/22/22 2043  ?Weight: 77.1 kg  ? ? ?Examination: ?Calm, NAD ?Cta no w/r ?Reg s1/s2 no gallop ?Soft benign +bs ?LLE swelling improving, erythema and warmth.  ?Aaoxox3  ?Mood and affect appropriate in current setting  ? ? ? ?Data Reviewed: I have personally reviewed following labs and imaging studies ? ?CBC: ?Recent Labs  ?Lab 03/22/22 ?2047 03/23/22 ?03/25/22 03/24/22 ?0415 03/25/22 ?05/25/22  ?WBC 24.2* 23.0* 17.6* 12.2*  ?NEUTROABS 20.8*  --   --   --   ?HGB 12.7 11.6* 11.6* 11.0*  ?HCT 38.9 35.2* 36.0 34.5*  ?MCV 93.7 94.6 94.0 94.5  ?PLT 311 275 314 320  ? ?Basic Metabolic Panel: ?Recent Labs  ?Lab 03/22/22 ?2047 03/23/22 ?03/25/22 03/24/22 ?03/26/22 03/25/22 ?0957  ?NA 136 135 138  --   ?K 4.0 4.1 3.8  --   ?CL 97* 101 105  --   ?CO2 27 24 25   --   ?GLUCOSE 115* 94 92  --   ?BUN 25* 27* 22  --   ?CREATININE 1.50* 1.21* 1.00 0.94  ?CALCIUM 9.7 8.8* 8.9  --   ?MG  --    --  2.3  --   ? ?GFR: ?Estimated Creatinine Clearance: 44.9 mL/min (by C-G formula based on SCr of 0.94 mg/dL). ?Liver Function Tests: ?Recent Labs  ?Lab 03/22/22 ?2047  ?AST 23  ?ALT 16  ?ALKPHOS 86  ?BILITOT 1.4*  ?PROT 8.8*  ?ALBUMIN 4.0  ? ?No results for input(s): LIPASE, AMYLASE in the last 168 hours. ?No results for input(s): AMMONIA in the last 168 hours. ?Coagulation Profile: ?No results for input(s): INR, PROTIME in the last 168 hours. ?Cardiac Enzymes: ?No results for input(s): CKTOTAL, CKMB, CKMBINDEX, TROPONINI in the last 168 hours. ?BNP (last 3 results) ?No results for input(s): PROBNP in the last 8760 hours. ?HbA1C: ?No results for input(s): HGBA1C in the last 72 hours. ?CBG: ?No results for input(s): GLUCAP in the last 168 hours. ?Lipid Profile: ?No results for input(s): CHOL, HDL, LDLCALC, TRIG, CHOLHDL, LDLDIRECT in the last 72 hours. ?Thyroid Function Tests: ?No results for input(s): TSH, T4TOTAL, FREET4, T3FREE, THYROIDAB in the last 72 hours. ?Anemia Panel: ?No results for input(s): VITAMINB12, FOLATE, FERRITIN, TIBC, IRON, RETICCTPCT in the last 72 hours. ?Sepsis Labs: ?Recent Labs  ?  Lab 03/22/22 ?2047  ?PROCALCITON 3.37  ?LATICACIDVEN 1.5  ? ? ?Recent Results (from the past 240 hour(s))  ?Blood culture (routine x 2)     Status: None (Preliminary result)  ? Collection Time: 03/22/22  8:47 PM  ? Specimen: BLOOD  ?Result Value Ref Range Status  ? Specimen Description BLOOD RIGHT ANTECUBITAL  Final  ? Special Requests   Final  ?  BOTTLES DRAWN AEROBIC AND ANAEROBIC Blood Culture adequate volume  ? Culture   Final  ?  NO GROWTH 3 DAYS ?Performed at [facility], [address], [place], [place] [address] ?  ? Report Status PENDING  Incomplete  ?Blood culture (routine x 2)     Status: None (Preliminary result)  ? Collection Time: 03/23/22  2:06 AM  ? Specimen: BLOOD  ?Result Value Ref Range Status  ? Specimen Description BLOOD LEFT ANTECUBITAL  Final  ? Special Requests   Final  ?   BOTTLES DRAWN AEROBIC AND ANAEROBIC Blood Culture results may not be optimal due to an inadequate volume of blood received in culture bottles  ? Culture   Final  ?  NO GROWTH 2 DAYS ?Performed at [facility] [address], [place], [place] [address] ?  ? Report Status PENDING  Incomplete  ?  ? ? ? ? ? ?Radiology Studies: ?US Venous Img Lower Unilateral Left (DVT) ? ?Result Date: 03/25/2022 ?CLINICAL DATA:  LEFT lower extremity swelling. EXAM: LEFT LOWER EXTREMITY VENOUS DOPPLER ULTRASOUND TECHNIQUE: Gray-scale sonography with compression, as well as color and duplex ultrasound, were performed to evaluate the deep venous system(s) from the level of the common femoral vein through the popliteal and proximal calf veins. COMPARISON:  Chest XR, 03/22/2022 FINDINGS: Suboptimal evaluation, with poor acoustic penetration secondary to patient habitus. VENOUS Normal compressibility of the common femoral, superficial femoral, and popliteal veins, as well as the visualized calf veins. Visualized portions of profunda femoral vein and great saphenous vein unremarkable. No filling defects to suggest DVT on grayscale or color Doppler imaging. Doppler waveforms show normal direction of venous flow, normal respiratory plasticity and response to augmentation. Limited views of the contralateral common femoral vein are unremarkable. OTHER No evidence of superficial thrombophlebitis or abnormal fluid collection. Limitations: none IMPRESSION: Suboptimal evaluation, within these constraints; No evidence of femoropopliteal DVT within the LEFT lower extremity. Roanna Banning, MD [facility] Radiology Electronically Signed   By: Roanna Banning M.D.   On: 03/25/2022 00:41   ? ? ? ? ? ?Scheduled Meds: ? busPIRone  15 mg Oral BID  ? donepezil  10 mg Oral Daily  ? mirabegron ER  50 mg Oral Daily  ? mirtazapine  15 mg Oral QHS  ? sertraline  100 mg Oral Daily  ? ?Continuous Infusions: ?  cefTRIAXone (ROCEPHIN)  IV Stopped (03/25/22 0153)  ? vancomycin 1,000 mg (03/25/22 1206)  ? ? ?Assessment & Plan: ?  ?Principal Problem: ?  Cellulitis of left lower extremity ?Active Problems: ?  AKI (acute kidney injury) (HCC) ?  GERD (gastroesophageal reflux disease) ?  Depression ?  Urinary incontinence ?  Dementia without behavioral disturbance (HCC) ? ? ?Cellulitis of left lower extremity ?Leg swelling improving with elevation. Not much difference with erythema improving. ?Wbc down ?Continue iv abx ?Consult ID ?Venous US neg. For dvt ? ?  ?AKI (acute kidney injury) (HCC) ?Improved with ivf ?  ?Dementia without behavioral disturbance (HCC) ?Continue with remeron and aricept ? ? ? ?  ?Depression ?Continue  with remeron and zoloft ? ? ?GERD (gastroesophageal reflux disease) ?Continue famotidine ? ? ?DVT prophylaxis: Lovenox ?Code Status: ?Family Communication: None at bedside ?Disposition Plan:  ?Status is: Inpatient ?Remains inpatient appropriate because: IV antibiotics. ID consult ? ? ? ? ? LOS: 2 days  ? ?Time spent: 35 min with >50% on coc ? ? ? ?Lynn ItoSahar Mrk Buzby, MD ?[facility] ?Pager [phone] ? ?If 7PM-7AM, please contact night-coverage ?03/25/2022, 12:15 PM   ?

## 2022-03-25 NOTE — Progress Notes (Signed)
Mobility Specialist - Progress Note ? ? ? 03/25/22 1200  ?Mobility  ?Activity Ambulated independently in hallway;Stood at bedside  ?Level of Assistance Modified independent, requires aide device or extra time  ?Assistive Device Front wheel walker  ?Distance Ambulated (ft) 320 ft  ?Activity Response Tolerated well  ?$Mobility charge 1 Mobility  ? ? ?Pt sitting in recliner upon arrival using RA. Pt completes STS with supervision and ambulates 2 laps with ModI, RW for pain management. Pt voices leg pain throughout but motivated to continue as pain decreases with gait. Returns to chair with needs in reach. ? ?Clarisa Schools ?Mobility Specialist ?03/25/22, 12:24 PM ? ? ? ? ?

## 2022-03-26 DIAGNOSIS — L03116 Cellulitis of left lower limb: Secondary | ICD-10-CM | POA: Diagnosis not present

## 2022-03-26 DIAGNOSIS — L03115 Cellulitis of right lower limb: Secondary | ICD-10-CM

## 2022-03-26 LAB — CREATININE, SERUM
Creatinine, Ser: 0.91 mg/dL (ref 0.44–1.00)
GFR, Estimated: >60 mL/min (ref >60)

## 2022-03-26 MED ORDER — ENOXAPARIN SODIUM 40 MG/0.4ML IJ SOSY
40.0000 mg | PREFILLED_SYRINGE | INTRAMUSCULAR | Status: DC
  Administered 2022-03-26: 40 mg via SUBCUTANEOUS
  Filled 2022-03-26: qty 0.4

## 2022-03-26 MED ORDER — FAMOTIDINE 20 MG PO TABS
20.0000 mg | ORAL_TABLET | Freq: Two times a day (BID) | ORAL | Status: DC
  Administered 2022-03-26 – 2022-03-27 (×3): 20 mg via ORAL
  Filled 2022-03-26 (×3): qty 1

## 2022-03-26 NOTE — Progress Notes (Signed)
? ?  Date of Admission:  03/23/2022    ?ID: Melissa Anthony is a 88 y.o. female  ?Principal Problem: ?  Cellulitis of left lower extremity ?Active Problems: ?  GERD (gastroesophageal reflux disease) ?  Depression ?  AKI (acute kidney injury) (HCC) ?  Urinary incontinence ?  Dementia without behavioral disturbance (HCC) ? ? ? ?Subjective: ?Daughter at bedside ?Patient doing better ? ?Medications:  ? busPIRone  15 mg Oral BID  ? donepezil  10 mg Oral Daily  ? enoxaparin (LOVENOX) injection  40 mg Subcutaneous Q24H  ? famotidine  20 mg Oral BID  ? mirabegron ER  50 mg Oral Daily  ? mirtazapine  15 mg Oral QHS  ? sertraline  100 mg Oral Daily  ? ? ?Objective: ?Vital signs in last 24 hours: ?Temp:  [97.8 ?F (36.6 ?C)-98.7 ?F (37.1 ?C)] 98.7 ?F (37.1 ?C) (05/02 0735) ?Pulse Rate:  [73-90] 73 (05/02 0735) ?Resp:  [15-20] 15 (05/02 0735) ?BP: (114-170)/(54-83) 121/62 (05/02 0735) ?SpO2:  [97 %-100 %] 97 % (05/02 0735) ? ?PHYSICAL EXAM:  ?General: Alert, cooperative, no distress, appears stated age.  ?Head: Normocephalic, without obvious abnormality, atraumatic. ?Eyes: Conjunctivae clear, anicteric sclerae. Pupils are equal ?ENT Nares normal. No drainage or sinus tenderness. ?Lips, mucosa, and tongue normal. No Thrush ?Neck: Supple, symmetrical, no adenopathy, thyroid: non tender ?no carotid bruit and no JVD. ?Back: No CVA tenderness. ?Lungs: Clear to auscultation bilaterally. No Wheezing or Rhonchi. No rales. ?Heart: Regular rate and rhythm, no murmur, rub or gallop. ?Abdomen: Soft, non-tender,not distended. Bowel sounds normal. No masses ?Extremities:  ? ? ? ? ?Skin: No rashes or lesions. Or bruising ?Lymph: Cervical, supraclavicular normal. ?Neurologic: Grossly non-focal ? ?Lab Results ?Recent Labs  ?  03/24/22 ?0415 03/25/22 ?3244 03/25/22 ?0957 03/26/22 ?0405  ?WBC 17.6* 12.2*  --   --   ?HGB 11.6* 11.0*  --   --   ?HCT 36.0 34.5*  --   --   ?NA 138  --   --   --   ?K 3.8  --   --   --   ?CL 105  --   --   --   ?CO2 25   --   --   --   ?BUN 22  --   --   --   ?CREATININE 1.00  --  0.94 0.91  ? ?Microbiology: ?Streptococcus pyogenes culture from the right leg wound ? ? ? ?Assessment/Plan: ?Cellulitis both legs left more than right with multiple wounds which are scabbing.  The right leg wound has Streptococcus pyogenes.  I would consider the same in the left leg as well.  Patient is currently on cefazolin.  On discharge can be sent on cefadroxil 1 g twice daily for 7 days.  I will follow her as outpatient. ? ?Depression on medazepam, sertraline and buspirone ? ?Early memory loss on donezepil ? ?Discussed the management with the patient and her daughter at bedside. ? ? ?  ?

## 2022-03-26 NOTE — TOC Progression Note (Signed)
Transition of Care (TOC) - Progression Note  ? ? ?Patient Details  ?Name: Melissa Anthony ?MRN: [id number] ?Date of Birth: [birth year] ? ?Transition of Care (TOC) CM/SW Contact  ?Caryn Section, RN ?Phone Number: ?03/26/2022, 1:33 PM ? ?Clinical Narrative:   Possible discharge tomorrow, no follow up with physical therapy needed.  Infectious Disease consulted, will follow up if IV antibiotics are needed.   ? ? ? ?  ?  ? ?Expected Discharge Plan and Services ?  ?  ?  ?  ?  ?                ?  ?  ?  ?  ?  ?  ?  ?  ?  ?  ? ? ?Social Determinants of Health (SDOH) Interventions ?  ? ?Readmission Risk Interventions ?   ? View : No data to display.  ?  ?  ?  ? ? ?

## 2022-03-26 NOTE — Progress Notes (Signed)
Mobility Specialist - Progress Note ? ? ? 03/26/22 1300  ?Mobility  ?Activity Ambulated independently in hallway;Stood at bedside;Dangled on edge of bed  ?Level of Assistance Independent  ?Assistive Device None;Front wheel walker  ?Distance Ambulated (ft) 600 ft  ?$Mobility charge 1 Mobility  ? ? ?Pt sitting in recliner upon arrival with family at bedside. Completes STS with supervision and ambulates 41ft independent with RW. Author removes RW and pt ambulates 238ft no AD with supervision -- no complaints and no LOB. Tolerated well. Pt is left in recliner with needs in reach. ? ?Clarisa Schools ?Mobility Specialist ?03/26/22, 2:00 PM ? ? ? ? ?

## 2022-03-26 NOTE — Care Management Important Message (Signed)
Important Message ? ?Patient Details  ?Name: Melissa Anthony ?MRN: [id number] ?Date of Birth: 08-07-1934 ? ? ?Medicare Important Message Given:  Yes ? ? ? ? ?[name] ?03/26/2022, 12:01 PM ?

## 2022-03-26 NOTE — Progress Notes (Signed)
?PROGRESS NOTE ? ? ? ?Melissa Anthony  ZTI:[id number] DOB: 1934-04-17 DOA: 03/23/2022 ?PCP: Lauro Regulus, MD  ? ? ?Brief Narrative:  ?Melissa Anthony is a 88 y.o. female with medical history significant for Depression and anxiety, dementia, GERD and urinary incontinence who presents to the ED with a complaint of left lower extremity pain and swelling . Admitted for LLE cellulitis.  ?ID consulted. On iv abx. Wound cx pending ? ? ? ?Consultants:  ?ID ? ?Procedures:  ? ?Antimicrobials:  ? Rocephin and vancomycin..>dc 5/2 ?Cefazolin 5/2>>> ? ? ?Subjective: ?No sob, cp, no dizziness ? ?Objective: ?Vitals:  ? 03/25/22 1646 03/25/22 1942 03/26/22 0506 03/26/22 0735  ?BP: (!) 114/54 (!) 152/59 (!) 170/83 121/62  ?Pulse: 79 90 80 73  ?Resp: 16 20 18 15   ?Temp: 97.8 ?F (36.6 ?C) 98.5 ?F (36.9 ?C) 98 ?F (36.7 ?C) 98.7 ?F (37.1 ?C)  ?TempSrc:      ?SpO2: 98% 97% 100% 97%  ?Weight:      ?Height:      ? ? ?Intake/Output Summary (Last 24 hours) at 03/26/2022 1203 ?Last data filed at 03/26/2022 0900 ?Gross per 24 hour  ?Intake 840 ml  ?Output --  ?Net 840 ml  ? ?Filed Weights  ? 03/22/22 2043  ?Weight: 77.1 kg  ? ? ?Examination: ?Calm, NAD ?Cta no w/r ?Reg s1/s2 no gallop ?Soft benign +bs ?Decrease LLE edema and erythema.  ?Aaoxox3  ?Mood and affect appropriate in current setting  ? ? ? ?Data Reviewed: I have personally reviewed following labs and imaging studies ? ?CBC: ?Recent Labs  ?Lab 03/22/22 ?2047 03/23/22 ?03/25/22 03/24/22 ?0415 03/25/22 ?05/25/22  ?WBC 24.2* 23.0* 17.6* 12.2*  ?NEUTROABS 20.8*  --   --   --   ?HGB 12.7 11.6* 11.6* 11.0*  ?HCT 38.9 35.2* 36.0 34.5*  ?MCV 93.7 94.6 94.0 94.5  ?PLT 311 275 314 320  ? ?Basic Metabolic Panel: ?Recent Labs  ?Lab 03/22/22 ?2047 03/23/22 ?03/25/22 03/24/22 ?03/26/22 03/25/22 ?0957 03/26/22 ?0405  ?NA 136 135 138  --   --   ?K 4.0 4.1 3.8  --   --   ?CL 97* 101 105  --   --   ?CO2 27 24 25   --   --   ?GLUCOSE 115* 94 92  --   --   ?BUN 25* 27* 22  --   --   ?CREATININE 1.50* 1.21* 1.00 0.94  0.91  ?CALCIUM 9.7 8.8* 8.9  --   --   ?MG  --   --  2.3  --   --   ? ?GFR: ?Estimated Creatinine Clearance: 46.4 mL/min (by C-G formula based on SCr of 0.91 mg/dL). ?Liver Function Tests: ?Recent Labs  ?Lab 03/22/22 ?2047  ?AST 23  ?ALT 16  ?ALKPHOS 86  ?BILITOT 1.4*  ?PROT 8.8*  ?ALBUMIN 4.0  ? ?No results for input(s): LIPASE, AMYLASE in the last 168 hours. ?No results for input(s): AMMONIA in the last 168 hours. ?Coagulation Profile: ?No results for input(s): INR, PROTIME in the last 168 hours. ?Cardiac Enzymes: ?No results for input(s): CKTOTAL, CKMB, CKMBINDEX, TROPONINI in the last 168 hours. ?BNP (last 3 results) ?No results for input(s): PROBNP in the last 8760 hours. ?HbA1C: ?No results for input(s): HGBA1C in the last 72 hours. ?CBG: ?No results for input(s): GLUCAP in the last 168 hours. ?Lipid Profile: ?No results for input(s): CHOL, HDL, LDLCALC, TRIG, CHOLHDL, LDLDIRECT in the last 72 hours. ?Thyroid Function Tests: ?No results for input(s): TSH, T4TOTAL,  FREET4, T3FREE, THYROIDAB in the last 72 hours. ?Anemia Panel: ?No results for input(s): VITAMINB12, FOLATE, FERRITIN, TIBC, IRON, RETICCTPCT in the last 72 hours. ?Sepsis Labs: ?Recent Labs  ?Lab 03/22/22 ?2047  ?PROCALCITON 3.37  ?LATICACIDVEN 1.5  ? ? ?Recent Results (from the past 240 hour(s))  ?Blood culture (routine x 2)     Status: None (Preliminary result)  ? Collection Time: 03/22/22  8:47 PM  ? Specimen: BLOOD  ?Result Value Ref Range Status  ? Specimen Description BLOOD RIGHT ANTECUBITAL  Final  ? Special Requests   Final  ?  BOTTLES DRAWN AEROBIC AND ANAEROBIC Blood Culture adequate volume  ? Culture   Final  ?  NO GROWTH 4 DAYS ?Performed at [facility], [address] [place], [place] [address] ?  ? Report Status PENDING  Incomplete  ?Blood culture (routine x 2)     Status: None (Preliminary result)  ? Collection Time: 03/23/22  2:06 AM  ? Specimen: BLOOD  ?Result Value Ref Range Status  ? Specimen Description BLOOD LEFT  ANTECUBITAL  Final  ? Special Requests   Final  ?  BOTTLES DRAWN AEROBIC AND ANAEROBIC Blood Culture results may not be optimal due to an inadequate volume of blood received in culture bottles  ? Culture   Final  ?  NO GROWTH 3 DAYS ?Performed at [facility], [address] [place], [place] [address] ?  ? Report Status PENDING  Incomplete  ?Aerobic Culture w Gram Stain (superficial specimen)     Status: None (Preliminary result)  ? Collection Time: 03/25/22  3:28 PM  ? Specimen: Leg; Wound  ?Result Value Ref Range Status  ? Specimen Description   Final  ?  LEG ?Performed at [facility], [address] [place], [place] [address] ?  ? Special Requests   Final  ?  NONE ?Performed at [facility], [address] [place], [place] [address] ?  ? Gram Stain   Final  ?  WBC PRESENT, PREDOMINANTLY MONONUCLEAR ?FEW GRAM POSITIVE COCCI IN PAIRS ?  ? Culture   Final  ?  CULTURE REINCUBATED FOR BETTER GROWTH ?Performed at [facility], [address] [place], [place] [address] ?  ? Report Status PENDING  Incomplete  ?  ? ? ? ? ? ?Radiology Studies: ?No results found. ? ? ? ? ? ?Scheduled Meds: ? busPIRone  15 mg Oral BID  ? donepezil  10 mg Oral Daily  ? mirabegron ER  50 mg Oral Daily  ? mirtazapine  15 mg Oral QHS  ? sertraline  100 mg Oral Daily  ? ?Continuous Infusions: ?  ceFAZolin (ANCEF) IV 1 g (03/26/22 0640)  ? ? ?Assessment & Plan: ?  ?Principal Problem: ?  Cellulitis of left lower extremity ?Active Problems: ?  AKI (acute kidney injury) (HCC) ?  GERD (gastroesophageal reflux disease) ?  Depression ?  Urinary incontinence ?  Dementia without behavioral disturbance (HCC) ? ? ?Cellulitis of left lower extremity ?Slowly improving ?Wbc down ?ID input appreciated, wound cx obtained, pending ?Vanco and rocephin dc/';d started on iv cefazolin.  ?Likely needs one more day of iv abx as still erythematous ?Venous US neg. For dvt ?Will need f/u with vascular surgery as outpatient for  venous dz w/u ?Keep legs elevated ? ? ?  ?AKI (acute kidney injury) (HCC) ?Improved with IV fluids ?  ?Dementia without behavioral disturbance (HCC) ?Continue Aricept and Remeron  ? ? ? ? ?  ?Depression ?Continue  Remeron and Zoloft  ? ? ? ?GERD ?Continue famotidine ? ?DVT prophylaxis: Lovenox ?Code Status: ?Family Communication: family at bedside. ?Disposition Plan:  ?Status is: Inpatient ?Remains inpatient appropriate because: IV antibiotics.  ? ? ? ? ? LOS: 3 days  ? ?Time spent: 35 min with >50% on coc ? ? ? ?Lynn Ito, MD ?[facility] ?Pager [phone] ? ?If 7PM-7AM, please contact night-coverage ?03/26/2022, 12:03 PM   ?

## 2022-03-26 NOTE — TOC Initial Note (Signed)
Transition of Care (TOC) - Initial/Assessment Note  ? ? ?Patient Details  ?Name: Melissa Anthony ?MRN: [id number] ?Date of Birth: 01-16-1934 ? ?Transition of Care (TOC) CM/SW Contact:    ?Caryn Section, RN ?Phone Number: ?03/26/2022, 5:26 PM ? ?Clinical Narrative:       Patient lives at home alone, she states she likes being independent and working in her garden and mowing her yard..  Daughter is primary caregiver, and very supportive, she provides transportation and assists with medications.          ? ?Daughter at bedside, states they have many friends who check in on patient.  Daughter understanding that patient does not need PT home health due to distance ambulated and recommendations from Physical Therapy. ? ?Daughter's main concern is that patient works in her yard, and she believes that is where patient's leg became infected.  She is concerned about patient falls, and has lined up church family and neighbors to assist while family is on vacation later this week. ? ?Patient is pleasant, but confused.  She asked several times during visit "What happened to my leg?  Nobody told me"  Reoriented as appropriate. ? ?Sent information to [facility], as nursing for disease management was ordered.  [place] to notify [facility] of decision to accept or not.  Daughter understanding, and states she is looking forward to her mother being in her own home again. ?Expected Discharge Plan: Home/Self Care ?Barriers to Discharge: Continued Medical Work up ? ? ?Patient Goals and CMS Choice ?Patient states their goals for this hospitalization and ongoing recovery are:: To go home and be independent ?  ?  ? ?Expected Discharge Plan and Services ?Expected Discharge Plan: Home/Self Care ?  ?  ?  ?Living arrangements for the past 2 months: Single Family Home ?                ?  ?  ?  ?  ?  ?  ?  ?  ?  ?  ? ?Prior Living Arrangements/Services ?Living arrangements for the past 2 months: Single Family Home ?Lives with:: Self ?Patient  language and need for interpreter reviewed:: Yes (No interpreter required, daughter at bedside.) ?Do you feel safe going back to the place where you live?: Yes      ?Need for Family Participation in Patient Care: Yes (Comment) ?Care giver support system in place?: Yes (comment) ?  ?Criminal Activity/Legal Involvement Pertinent to Current Situation/Hospitalization: No - Comment as needed ? ?Activities of Daily Living ?Home Assistive Devices/Equipment: None ?ADL Screening (condition at time of admission) ?Patient's cognitive ability adequate to safely complete daily activities?: Yes ?Is the patient deaf or have difficulty hearing?: No ?Does the patient have difficulty seeing, even when wearing glasses/contacts?: No ?Does the patient have difficulty concentrating, remembering, or making decisions?: Yes ?Patient able to express need for assistance with ADLs?: Yes ?Does the patient have difficulty dressing or bathing?: No ?Independently performs ADLs?: Yes (appropriate for developmental age) ?Does the patient have difficulty walking or climbing stairs?: Yes ?Weakness of Legs: Left ?Weakness of Arms/Hands: None ? ?Permission Sought/Granted ?Permission sought to share information with : Case Manager ?Permission granted to share information with : Yes, Verbal Permission Granted ?   ? Permission granted to share info w AGENCY: Axillary agencies as applicable. ?   ?   ? ?Emotional Assessment ?Appearance:: Appears stated age ?Attitude/Demeanor/Rapport: Gracious, Inconsistent, Complaining (Confused at times, daughter at bedside.) ?  ?Orientation: : Oriented to Self, Oriented to  Place ?Alcohol / Substance Use: Not Applicable ?Psych Involvement: No (comment) ? ?Admission diagnosis:  Cellulitis of left lower extremity [L03.116] ?AKI (acute kidney injury) (HCC) [N17.9] ?Leukocytosis, unspecified type [D72.829] ?Patient Active Problem List  ? Diagnosis Date Noted  ? Cellulitis of left lower extremity 03/23/2022  ? GERD  (gastroesophageal reflux disease)   ? Depression   ? AKI (acute kidney injury) (HCC)   ? Urinary incontinence   ? Dementia without behavioral disturbance (HCC)   ? CAP (community acquired pneumonia) 03/24/2018  ? ?PCP:  Lauro Regulus, MD ?Pharmacy:   ?[facility] [place] [place] - [address] ?[address] ?[place] [address] ?Phone: [phone] Fax: [phone] ? ? ? ? ?Social Determinants of Health (SDOH) Interventions ?  ? ?Readmission Risk Interventions ? ?  03/26/2022  ?  4:44 PM  ?Readmission Risk Prevention Plan  ?Post Dischage Appt Complete  ?Medication Screening Complete  ?Transportation Screening Complete  ? ? ? ?

## 2022-03-26 NOTE — Progress Notes (Signed)
Mobility Specialist - Progress Note ? ? ? 03/26/22 1600  ?Mobility  ?Activity Ambulated independently in hallway;Stood at bedside;Dangled on edge of bed  ?Level of Assistance Independent after set-up  ?Assistive Device Front wheel walker  ?Distance Ambulated (ft) 600 ft  ?Activity Response Tolerated well  ?$Mobility charge 1 Mobility  ? ? ?Pt supine upon arrival using RA. Completes all activities indep with RW --- impulsive to stand without awareness of IV. Pt voices L LE pain but tolerates well. Pt left in chair with needs in reach and daughter at bedside. ? ?Merrily Brittle ?Mobility Specialist ?03/26/22, 4:31 PM ? ?

## 2022-03-27 LAB — BASIC METABOLIC PANEL
Anion gap: 9 (ref 5–15)
BUN: 16 mg/dL (ref 8–23)
CO2: 23 mmol/L (ref 22–32)
Calcium: 8.8 mg/dL — ABNORMAL LOW (ref 8.9–10.3)
Chloride: 105 mmol/L (ref 98–111)
Creatinine, Ser: 0.96 mg/dL (ref 0.44–1.00)
GFR, Estimated: 58 mL/min — ABNORMAL LOW (ref >60)
Glucose, Bld: 108 mg/dL — ABNORMAL HIGH (ref 70–99)
Potassium: 4 mmol/L (ref 3.5–5.1)
Sodium: 137 mmol/L (ref 135–145)

## 2022-03-27 LAB — CULTURE, BLOOD (ROUTINE X 2)
Culture: NO GROWTH
Special Requests: ADEQUATE

## 2022-03-27 LAB — AEROBIC CULTURE W GRAM STAIN (SUPERFICIAL SPECIMEN)

## 2022-03-27 LAB — CBC
HCT: 35.1 % — ABNORMAL LOW (ref 36.0–46.0)
Hemoglobin: 11.4 g/dL — ABNORMAL LOW (ref 12.0–15.0)
MCH: 30.2 pg (ref 26.0–34.0)
MCHC: 32.5 g/dL (ref 30.0–36.0)
MCV: 92.9 fL (ref 80.0–100.0)
Platelets: 385 10*3/uL (ref 150–400)
RBC: 3.78 MIL/uL — ABNORMAL LOW (ref 3.87–5.11)
RDW: 12.9 % (ref 11.5–15.5)
WBC: 11.3 10*3/uL — ABNORMAL HIGH (ref 4.0–10.5)
nRBC: 0 % (ref 0.0–0.2)

## 2022-03-27 MED ORDER — CEFADROXIL 500 MG PO CAPS
1000.0000 mg | ORAL_CAPSULE | Freq: Two times a day (BID) | ORAL | 0 refills | Status: AC
Start: 2022-03-27 — End: 2022-04-03

## 2022-03-27 MED ORDER — OXYCODONE-ACETAMINOPHEN 5-325 MG PO TABS: 1.0000 | ORAL_TABLET | ORAL | 0 refills | Status: DC | PRN

## 2022-03-27 NOTE — Discharge Summary (Signed)
Physician Discharge Summary  ?Melissa Anthony:[id number] DOB: Mar 01, 1934 DOA: 03/23/2022 ? ?PCP: Lauro Regulus, MD ? ?Admit date: 03/23/2022 ?Discharge date: 03/27/2022 ? ?Admitted From: Home ?Disposition:  Home with home health ? ?Recommendations for Outpatient Follow-up:  ?Follow up with PCP in 1-2 weeks ?Referral to vascular surgery ?Follow-up with infectious disease ? ?Home Health: Yes RN, aide ?Equipment/Devices: None ? ?Discharge Condition: Stable ?CODE STATUS: Full ?Diet recommendation: Regular ? ?Brief/Interim Summary: ?Melissa Anthony is a 88 y.o. female with medical history significant for Depression and anxiety, dementia, GERD and urinary incontinence who presents to the ED with a complaint of left lower extremity pain and swelling that started a few days prior.  She denies fever or chills. ?She is diagnosed with left lower extremity cellulitis, was started on antibiotics with Rocephin and vancomycin. ? ?Received 4 days of inpatient IV antibiotics.  Cellulitic changes have improved.  Leg no longer tender to touch.  Adequate peripheral pulses.  Cleared for discharge.  Will transition to cefadroxil per ID recommendations.  Completed additional 7 days at time of discharge.  Follow-up outpatient with infectious disease.  Internal referral to vascular surgery placed ? ? ? ?Discharge Diagnoses:  ?Principal Problem: ?  Cellulitis of left lower extremity ?Active Problems: ?  AKI (acute kidney injury) (HCC) ?  GERD (gastroesophageal reflux disease) ?  Depression ?  Urinary incontinence ?  Dementia without behavioral disturbance (HCC) ? ?* Cellulitis of left lower extremity ?Patient presented with bilateral leg swelling worse on left.  Legs tender to touch.  Started on vancomycin and Rocephin.  Infectious disease involved.  Titrated to Ancef after consultation.  Can transition to oral cefadroxil at time of discharge.  Complete additional 7 days.  Outpatient follow-up with infectious disease.   ? ?Also suggest  outpatient vascular surgery evaluation considering high suspicion for venous insufficiency underlying the patient's bilateral lower extremity cellulitis.  Internal referral has been placed. ?  ?AKI (acute kidney injury) (HCC) ?Renal function improving, patient has adequate p.o. intake.  ?  ?Dementia without behavioral disturbance (HCC) ?Reordered home medicines ?  ?Depression ?Reordered home medicines. ?  ?GERD (gastroesophageal reflux disease) ?On famotidine ? ?Discharge Instructions ? ?Discharge Instructions   ? ? Ambulatory referral to Vascular Surgery   Complete by: As directed ?  ? Diet - low sodium heart healthy   Complete by: As directed ?  ? Increase activity slowly   Complete by: As directed ?  ? No wound care   Complete by: As directed ?  ? ?  ? ?Allergies as of 03/27/2022   ?No Known Allergies ?  ? ?  ?Medication List  ?  ? ?STOP taking these medications   ? ?estradiol 1 MG tablet ?Commonly known as: ESTRACE ?  ? ?  ? ?TAKE these medications   ? ?acetaminophen 325 MG tablet ?Commonly known as: TYLENOL ?Take 650 mg by mouth every 6 (six) hours as needed for moderate pain. ?  ?busPIRone 15 MG tablet ?Commonly known as: BUSPAR ?Take 15 mg by mouth 2 (two) times daily. ?  ?cefadroxil 500 MG capsule ?Commonly known as: DURICEF ?Take 2 capsules (1,000 mg total) by mouth 2 (two) times daily for 7 days. ?  ?donepezil 10 MG tablet ?Commonly known as: ARICEPT ?Take 10 mg by mouth daily. ?  ?famotidine 20 MG tablet ?Commonly known as: PEPCID ?Take 20 mg by mouth 2 (two) times daily. ?  ?mirabegron ER 50 MG Tb24 tablet ?Commonly known as: MYRBETRIQ ?Take 1 tablet (50  mg total) by mouth daily. ?  ?mirtazapine 15 MG tablet ?Commonly known as: REMERON ?Take 15 mg by mouth at bedtime. ?  ?multivitamin with minerals Tabs tablet ?Take 1 tablet by mouth daily. ?  ?naproxen sodium 220 MG tablet ?Commonly known as: ALEVE ?Take 220-440 mg by mouth daily as needed (arthritis). ?  ?oxyCODONE-acetaminophen 5-325 MG tablet ?Commonly  known as: Percocet ?Take 1 tablet by mouth every 4 (four) hours as needed for severe pain. ?  ?sertraline 100 MG tablet ?Commonly known as: ZOLOFT ?Take 100 mg by mouth daily. ?  ? ?  ? ? Follow-up Information   ? ? Lauro Regulus, MD. Schedule an appointment as soon as possible for a visit in 1 week(s).   ?Specialty: Internal Medicine ?Contact information: ?[address] ?[facility] ?[place] [address] ?[phone] ? ? ?  ?  ? ? Lynn Ito, MD Follow up.   ?Specialty: Infectious Diseases ?Why: Dr. Lynne Logan office should contact you for followup instructions ?Contact information: ?[address] ?[place] [address] ?[phone] ? ? ?  ?  ? ?  ?  ? ?  ? ?No Known Allergies ? ?Consultations: ?ID ? ? ?Procedures/Studies: ?DG Chest 2 View ? ?Result Date: 03/22/2022 ?CLINICAL DATA:  Weakness EXAM: CHEST - 2 VIEW COMPARISON:  03/04/2021 FINDINGS: Cardiac shadow is within normal limits. Lungs are well aerated bilaterally. No focal infiltrate or effusion is seen. No acute bony abnormality is noted. IMPRESSION: No active cardiopulmonary disease. Electronically Signed   By: Alcide Clever M.D.   On: 03/22/2022 21:12  ? ?US Venous Img Lower Unilateral Left (DVT) ? ?Result Date: 03/25/2022 ?CLINICAL DATA:  LEFT lower extremity swelling. EXAM: LEFT LOWER EXTREMITY VENOUS DOPPLER ULTRASOUND TECHNIQUE: Gray-scale sonography with compression, as well as color and duplex ultrasound, were performed to evaluate the deep venous system(s) from the level of the common femoral vein through the popliteal and proximal calf veins. COMPARISON:  Chest XR, 03/22/2022 FINDINGS: Suboptimal evaluation, with poor acoustic penetration secondary to patient habitus. VENOUS Normal compressibility of the common femoral, superficial femoral, and popliteal veins, as well as the visualized calf veins. Visualized portions of profunda femoral vein and great saphenous vein unremarkable. No filling defects to  suggest DVT on grayscale or color Doppler imaging. Doppler waveforms show normal direction of venous flow, normal respiratory plasticity and response to augmentation. Limited views of the contralateral common femoral vein are unremarkable. OTHER No evidence of superficial thrombophlebitis or abnormal fluid collection. Limitations: none IMPRESSION: Suboptimal evaluation, within these constraints; No evidence of femoropopliteal DVT within the LEFT lower extremity. Roanna Banning, MD [facility] Electronically Signed   By: Roanna Banning M.D.   On: 03/25/2022 00:41   ? ? ? ?Subjective: ?Seen and examined day of discharge.  Cellulitic changes improving.  Pain in left lower extremity slightly worse than the right but overall improved. ? ?Discharge Exam: ?Vitals:  ? 03/27/22 0747 03/27/22 0857  ?BP: (!) 177/78 (!) 127/56  ?Pulse: 74 74  ?Resp: 18   ?Temp: 98.2 ?F (36.8 ?C)   ?SpO2: 100%   ? ?Vitals:  ? 03/26/22 2021 03/27/22 1740 03/27/22 0747 03/27/22 0857  ?BP: (!) 135/54 120/68 (!) 177/78 (!) 127/56  ?Pulse: 83 74 74 74  ?Resp: 18 18 18    ?Temp: 98.3 ?F (36.8 ?C) 98.3 ?F (36.8 ?C) 98.2 ?F (36.8 ?C)   ?TempSrc: Oral Oral Oral   ?SpO2: 95% 95% 100%   ?Weight:      ?  Height:      ? ? ?General: Pt is alert, awake, not in acute distress ?Cardiovascular: RRR, S1/S2 +, no rubs, no gallops ?Respiratory: CTA bilaterally, no wheezing, no rhonchi ?Abdominal: Soft, NT, ND, bowel sounds + ?Extremities: Bilateral lower extremity erythema.  Worse on left.  Mildly tender to touch.  Adequate peripheral pulses. ? ? ? ?The results of significant diagnostics from this hospitalization (including imaging, microbiology, ancillary and laboratory) are listed below for reference.   ? ? ?Microbiology: ?Recent Results (from the past 240 hour(s))  ?Blood culture (routine x 2)     Status: None  ? Collection Time: 03/22/22  8:47 PM  ? Specimen: BLOOD  ?Result Value Ref Range Status  ? Specimen  Description BLOOD RIGHT ANTECUBITAL  Final  ? Special Requests   Final  ?  BOTTLES DRAWN AEROBIC AND ANAEROBIC Blood Culture adequate volume  ? Culture   Final  ?  NO GROWTH 5 DAYS ?Performed at [facility]

## 2022-03-27 NOTE — TOC Progression Note (Signed)
Transition of Care (TOC) - Progression Note  ? ? ?Patient Details  ?Name: Melissa Anthony ?MRN: [id number] ?Date of Birth: [birth year] ? ?Transition of Care (TOC) CM/SW Contact  ?Caryn Section, RN ?Phone Number: ?03/27/2022, 10:35 AM ? ?Clinical Narrative:   Patient discharged to home today.  [facility] to accept for nursing.  Daughter will transport, and states patient does not need DME as they have everything they need at home.  [employer] Resources provided to patient and daughter. ? ? ? ?Expected Discharge Plan: Home/Self Care ?Barriers to Discharge: Continued Medical Work up ? ?Expected Discharge Plan and Services ?Expected Discharge Plan: Home/Self Care ?  ?  ?  ?Living arrangements for the past 2 months: Single Family Home ?                ?  ?  ?  ?  ?  ?  ?  ?  ?  ?  ? ? ?Social Determinants of Health (SDOH) Interventions ?  ? ?Readmission Risk Interventions ? ?  03/26/2022  ?  4:44 PM  ?Readmission Risk Prevention Plan  ?Post Dischage Appt Complete  ?Medication Screening Complete  ?Transportation Screening Complete  ? ? ?

## 2022-03-28 LAB — CULTURE, BLOOD (ROUTINE X 2): Culture: NO GROWTH

## 2022-04-02 ENCOUNTER — Encounter: Payer: Self-pay | Admitting: Infectious Diseases

## 2022-04-02 ENCOUNTER — Ambulatory Visit: Payer: Medicare Other | Attending: Infectious Diseases | Admitting: Infectious Diseases

## 2022-04-02 VITALS — BP 122/72 | HR 89 | Temp 97.2°F | Ht 64.0 in | Wt 157.0 lb

## 2022-04-02 DIAGNOSIS — E785 Hyperlipidemia, unspecified: Secondary | ICD-10-CM | POA: Diagnosis not present

## 2022-04-02 DIAGNOSIS — L0100 Impetigo, unspecified: Secondary | ICD-10-CM | POA: Diagnosis not present

## 2022-04-02 DIAGNOSIS — F419 Anxiety disorder, unspecified: Secondary | ICD-10-CM | POA: Diagnosis not present

## 2022-04-02 DIAGNOSIS — L03116 Cellulitis of left lower limb: Secondary | ICD-10-CM | POA: Insufficient documentation

## 2022-04-02 DIAGNOSIS — F32A Depression, unspecified: Secondary | ICD-10-CM | POA: Insufficient documentation

## 2022-04-02 DIAGNOSIS — M1990 Unspecified osteoarthritis, unspecified site: Secondary | ICD-10-CM | POA: Insufficient documentation

## 2022-04-02 DIAGNOSIS — Z96651 Presence of right artificial knee joint: Secondary | ICD-10-CM | POA: Diagnosis not present

## 2022-04-02 NOTE — Progress Notes (Signed)
NAME: Melissa Anthony  ?DOB: 12-25-1933  ?MRN: [id number]  ?Date/Time: 04/02/2022 9:59 AM ? ? ?Subjective:  ? ?Patient here with her son-in-law. ? ?Melissa Anthony is a 88 y.o. with a history of right total knee replacement, hyperlipidemia, osteoarthritis, anxiety/depression as per medical records ?This is a follow-up after recent hospitalization for bilateral leg cellulitis and wound on the right leg secondary to Streptococcus pyogenes.  Patient was discharged home after getting IV antibiotics for 3 days on cefadroxil for 1 week ?She is doing much better ?She will be completing her last dose tomorrow ?The redness and swelling in the left leg has decreased and now the skin is peeling ?The wounds on the right leg is much improved ?She has no fever or chills ?Pain is minimal ?She is an active person ? ? ?Past Medical History:  ?Diagnosis Date  ? Anxiety   ? Arthritis   ? Depression   ? GERD (gastroesophageal reflux disease)   ?  ?Past Surgical History:  ?Procedure Laterality Date  ? 7 GAUGE PARS PLANA VITRECTOMY WITH 23 GAUGE MVR PORT    ? 23 GAUGE PARS PLANA VITRECTOMY WITH 23 GAUGE MVR PORT    ? 23 GAUGE PARS PLANA VITRECTOMY WITH 23 GAUGE MVR PORT    ? 23 GAUGE PARS PLANA VITRECTOMY WITH 23 GAUGE MVR PORT    ? 23 GAUGE PARS PLANA VITRECTOMY WITH 23 GAUGE MVR PORT    ? 23 GAUGE PARS PLANA VITRECTOMY WITH 23 GAUGE MVR PORT    ? 23 GAUGE PARS PLANA VITRECTOMY WITH 23 GAUGE MVR PORT    ? 23 GAUGE PARS PLANA VITRECTOMY WITH 23 GAUGE MVR PORT    ? 23 GAUGE PARS PLANA VITRECTOMY WITH 23 GAUGE MVR PORT    ? 23 GAUGE PARS PLANA VITRECTOMY WITH 23 GAUGE MVR PORT    ? 23 GAUGE PARS PLANA VITRECTOMY WITH 23 GAUGE MVR PORT    ? 23 GAUGE PARS PLANA VITRECTOMY WITH 23 GAUGE MVR PORT    ? 23 GAUGE PARS PLANA VITRECTOMY WITH 23 GAUGE MVR PORT    ? 23 GAUGE PARS PLANA VITRECTOMY WITH 23 GAUGE MVR PORT    ? 23 GAUGE PARS PLANA VITRECTOMY WITH 23 GAUGE MVR PORT    ? 23 GAUGE PARS PLANA VITRECTOMY WITH 23 GAUGE MVR PORT    ? 23 GAUGE PARS  PLANA VITRECTOMY WITH 23 GAUGE MVR PORT    ? 23 GAUGE PARS PLANA VITRECTOMY WITH 23 GAUGE MVR PORT    ? 23 GAUGE PARS PLANA VITRECTOMY WITH 23 GAUGE MVR PORT    ? 23 GAUGE PARS PLANA VITRECTOMY WITH 23 GAUGE MVR PORT    ? 23 GAUGE PARS PLANA VITRECTOMY WITH 23 GAUGE MVR PORT    ? 23 GAUGE PARS PLANA VITRECTOMY WITH 23 GAUGE MVR PORT    ? 23 GAUGE PARS PLANA VITRECTOMY WITH 23 GAUGE MVR PORT    ? 23 GAUGE PARS PLANA VITRECTOMY WITH 23 GAUGE MVR PORT    ? 23 GAUGE PARS PLANA VITRECTOMY WITH 23 GAUGE MVR PORT    ? 23 GAUGE PARS PLANA VITRECTOMY WITH 23 GAUGE MVR PORT    ? 23 GAUGE PARS PLANA VITRECTOMY WITH 23 GAUGE MVR PORT    ? 23 GAUGE PARS PLANA VITRECTOMY WITH 23 GAUGE MVR PORT    ? 23 GAUGE PARS PLANA VITRECTOMY WITH 23 GAUGE MVR PORT    ? 23 GAUGE PARS PLANA VITRECTOMY WITH 23 GAUGE MVR PORT    ? 23 GAUGE  PARS PLANA VITRECTOMY WITH 23 GAUGE MVR PORT    ? 23 GAUGE PARS PLANA VITRECTOMY WITH 23 GAUGE MVR PORT    ? 23 GAUGE PARS PLANA VITRECTOMY WITH 23 GAUGE MVR PORT    ? 23 GAUGE PARS PLANA VITRECTOMY WITH 23 GAUGE MVR PORT    ? 23 GAUGE PARS PLANA VITRECTOMY WITH 23 GAUGE MVR PORT    ? 23 GAUGE PARS PLANA VITRECTOMY WITH 23 GAUGE MVR PORT    ? 23 GAUGE PARS PLANA VITRECTOMY WITH 23 GAUGE MVR PORT    ? 23 GAUGE PARS PLANA VITRECTOMY WITH 23 GAUGE MVR PORT    ? 23 GAUGE PARS PLANA VITRECTOMY WITH 23 GAUGE MVR PORT    ? 23 GAUGE PARS PLANA VITRECTOMY WITH 23 GAUGE MVR PORT    ? 23 GAUGE PARS PLANA VITRECTOMY WITH 23 GAUGE MVR PORT    ? 23 GAUGE PARS PLANA VITRECTOMY WITH 23 GAUGE MVR PORT    ? 23 GAUGE PARS PLANA VITRECTOMY WITH 23 GAUGE MVR PORT    ? 23 GAUGE PARS PLANA VITRECTOMY WITH 23 GAUGE MVR PORT    ? 23 GAUGE PARS PLANA VITRECTOMY WITH 23 GAUGE MVR PORT    ? 23 GAUGE PARS PLANA VITRECTOMY WITH 23 GAUGE MVR PORT    ? 23 GAUGE PARS PLANA VITRECTOMY WITH 23 GAUGE MVR PORT    ? 23 GAUGE PARS PLANA VITRECTOMY WITH 23 GAUGE MVR PORT    ? 23 GAUGE PARS PLANA VITRECTOMY WITH 23 GAUGE MVR PORT    ? 23 GAUGE PARS  PLANA VITRECTOMY WITH 23 GAUGE MVR PORT    ? 23 GAUGE PARS PLANA VITRECTOMY WITH 23 GAUGE MVR PORT    ? 23 GAUGE PARS PLANA VITRECTOMY WITH 23 GAUGE MVR PORT    ? 23 GAUGE PARS PLANA VITRECTOMY WITH 23 GAUGE MVR PORT    ? 23 GAUGE PARS PLANA VITRECTOMY WITH 23 GAUGE MVR PORT    ? 23 GAUGE PARS PLANA VITRECTOMY WITH 23 GAUGE MVR PORT    ? 23 GAUGE PARS PLANA VITRECTOMY WITH 23 GAUGE MVR PORT    ? 23 GAUGE PARS PLANA VITRECTOMY WITH 23 GAUGE MVR PORT    ? 23 GAUGE PARS PLANA VITRECTOMY WITH 23 GAUGE MVR PORT    ? 23 GAUGE PARS PLANA VITRECTOMY WITH 23 GAUGE MVR PORT    ? 23 GAUGE PARS PLANA VITRECTOMY WITH 23 GAUGE MVR PORT    ? 23 GAUGE PARS PLANA VITRECTOMY WITH 23 GAUGE MVR PORT    ? 23 GAUGE PARS PLANA VITRECTOMY WITH 23 GAUGE MVR PORT    ? 23 GAUGE PARS PLANA VITRECTOMY WITH 23 GAUGE MVR PORT    ? 23 GAUGE PARS PLANA VITRECTOMY WITH 23 GAUGE MVR PORT    ? 23 GAUGE PARS PLANA VITRECTOMY WITH 23 GAUGE MVR PORT    ? 23 GAUGE PARS PLANA VITRECTOMY WITH 23 GAUGE MVR PORT    ? 23 GAUGE PARS PLANA VITRECTOMY WITH 23 GAUGE MVR PORT    ? 23 GAUGE PARS PLANA VITRECTOMY WITH 23 GAUGE MVR PORT    ? 23 GAUGE PARS PLANA VITRECTOMY WITH 23 GAUGE MVR PORT    ? 23 GAUGE PARS PLANA VITRECTOMY WITH 23 GAUGE MVR PORT    ? 23 GAUGE PARS PLANA VITRECTOMY WITH 23 GAUGE MVR PORT    ? 23 GAUGE PARS PLANA VITRECTOMY WITH 23 GAUGE MVR PORT    ? 23 GAUGE PARS PLANA VITRECTOMY WITH 23 GAUGE MVR PORT    ?  [place] VITRECTOMY WITH 23 GAUGE MVR PORT    ? 23 GAUGE PARS PLANA VITRECTOMY WITH 23 GAUGE MVR PORT    ? 23 GAUGE PARS PLANA VITRECTOMY WITH 23 GAUGE MVR PORT    ? 23 GAUGE PARS PLANA VITRECTOMY WITH 23 GAUGE MVR PORT    ? 23 GAUGE PARS PLANA VITRECTOMY WITH 23 GAUGE MVR PORT    ? 23 GAUGE PARS PLANA VITRECTOMY WITH 23 GAUGE MVR PORT    ? 23 GAUGE PARS PLANA VITRECTOMY WITH 23 GAUGE MVR PORT    ? 23 GAUGE PARS PLANA VITRECTOMY WITH 23 GAUGE MVR PORT    ? 23 GAUGE PARS PLANA VITRECTOMY WITH 23 GAUGE MVR PORT    ? 23 GAUGE PARS  PLANA VITRECTOMY WITH 23 GAUGE MVR PORT    ? 23 GAUGE PARS PLANA VITRECTOMY WITH 23 GAUGE MVR PORT    ? 23 GAUGE PARS PLANA VITRECTOMY WITH 23 GAUGE MVR PORT    ? 23 GAUGE PARS PLANA VITRECTOMY WITH 23 GAUGE MVR PORT    ? 23 GAUGE PARS PLANA VITRECTOMY WITH 23 GAUGE MVR PORT    ? 23 GAUGE PARS PLANA VITRECTOMY WITH 23 GAUGE MVR PORT    ? 23 GAUGE PARS PLANA VITRECTOMY WITH 23 GAUGE MVR PORT    ? ABDOMINAL HYSTERECTOMY    ? APPENDECTOMY    ? CATARACT EXTRACTION W/PHACO Left 06/16/2018  ? Procedure: CATARACT EXTRACTION PHACO AND INTRAOCULAR LENS PLACEMENT (IOC);  Surgeon: Birder Robson, MD;  Location: [facility] ORS;  Service: Ophthalmology;  Laterality: Left;  [place] 00:45.6 ?AP% 19.7 ?CDE 8.96 ?Fluid Pack lot # [id number]  ? CHOLECYSTECTOMY    ? COLON SURGERY    ? long time ago. can't remember why or what  ? COLONOSCOPY    ? EYE SURGERY Bilateral 2020  ? cataract extractions  ? JOINT REPLACEMENT Right 2010  ? TKR  ? TONSILLECTOMY    ?  ?Social History  ? ?Socioeconomic History  ? Marital status: Widowed  ?  Spouse name: Not on file  ? Number of children: 1  ? Years of education: Not on file  ? Highest education level: Not on file  ?Occupational History  ? Occupation: Scientist, product/process development for [employer]  ?  Comment: retired  ?Tobacco Use  ? Smoking status: Former  ?  Types: Cigarettes  ?  Quit date: 78  ?  Years since quitting: 43.3  ? Smokeless tobacco: Never  ?Vaping Use  ? Vaping Use: Never used  ?Substance and Sexual Activity  ? Alcohol use: Never  ? Drug use: Never  ? Sexual activity: Yes  ?Other Topics Concern  ? Not on file  ?Social History Narrative  ? Lives with husband, gardens daily.  ? ?Social Determinants of Health  ? ?Financial Resource Strain: Not on file  ?Food Insecurity: Not on file  ?Transportation Needs: Not on file  ?Physical Activity: Not on file  ?Stress: Not on file  ?Social Connections: Not on file  ?Intimate Partner Violence: Not on file  ?  ?No family history on file. ?No Known Allergies ?I? ?Current  Outpatient Medications  ?Medication Sig Dispense Refill  ? acetaminophen (TYLENOL) 325 MG tablet Take 650 mg by mouth every 6 (six) hours as needed for moderate pain.    ? busPIRone (BUSPAR) 15 MG tablet Take

## 2022-04-02 NOTE — Patient Instructions (Addendum)
You are here for follow up of cellulitis both legs due to streptococcus. Tomorrow you will complete 7  days of cefadroxil after getting Iv antibiotic . You are doing better- moisturize the legs with aveeno and you can wear compression socks- follow up as needed ?

## 2022-04-19 ENCOUNTER — Encounter (INDEPENDENT_AMBULATORY_CARE_PROVIDER_SITE_OTHER): Payer: Self-pay | Admitting: Nurse Practitioner

## 2022-04-19 ENCOUNTER — Ambulatory Visit (INDEPENDENT_AMBULATORY_CARE_PROVIDER_SITE_OTHER): Payer: Medicare Other | Admitting: Nurse Practitioner

## 2022-04-19 VITALS — BP 132/66 | HR 81 | Resp 16 | Wt 154.0 lb

## 2022-04-19 DIAGNOSIS — N1831 Chronic kidney disease, stage 3a: Secondary | ICD-10-CM

## 2022-04-19 DIAGNOSIS — L03116 Cellulitis of left lower limb: Secondary | ICD-10-CM | POA: Diagnosis not present

## 2022-04-28 ENCOUNTER — Encounter (INDEPENDENT_AMBULATORY_CARE_PROVIDER_SITE_OTHER): Payer: Self-pay | Admitting: Nurse Practitioner

## 2022-04-28 NOTE — Progress Notes (Signed)
Subjective:    Patient ID: Melissa Anthony, female    DOB: [birth year], 88 y.o.   MRN: [id number] Chief Complaint  Patient presents with   New Patient (Initial Visit)    Ref Georgeann Oppenheim consult for Lle swelling    Melissa Anthony is a 88 y.o. female with medical history significant for Depression and anxiety, dementia, GERD and urinary incontinence who presented to the ED with a complaint of left lower extremity pain and swelling that started a few days prior.  She denied fever or chills. She was diagnosed with left lower extremity cellulitis, was started on antibiotics with Rocephin and vancomycin.  Patient notes that the swelling is much improved.  She has completed her antibiotics.  She notes that overall her legs are much improved compared to when she was admitted.  It was noted that the cellulitis is likely the cause of trauma as the patient works out in her garden on a frequent basis.   Review of Systems  Cardiovascular:  Positive for leg swelling.  All other systems reviewed and are negative.     Objective:   Physical Exam Vitals reviewed.  HENT:     Head: Normocephalic.  Cardiovascular:     Rate and Rhythm: Normal rate.     Pulses: Normal pulses.  Pulmonary:     Effort: Pulmonary effort is normal.  Skin:    General: Skin is warm and dry.  Neurological:     Mental Status: She is alert and oriented to person, place, and time.  Psychiatric:        Mood and Affect: Mood normal.        Behavior: Behavior normal.        Thought Content: Thought content normal.        Judgment: Judgment normal.    BP 132/66 (BP Location: Left Arm)   Pulse 81   Resp 16   Wt 154 lb (69.9 kg)   BMI 26.43 kg/m   Past Medical History:  Diagnosis Date   Anxiety    Arthritis    Depression    GERD (gastroesophageal reflux disease)     Social History   Socioeconomic History   Marital status: Widowed    Spouse name: Not on file   Number of children: 1   Years of education: Not on file    Highest education level: Not on file  Occupational History   Occupation: Programmer, applications for [employer]    Comment: retired  Tobacco Use   Smoking status: Former    Types: Cigarettes    Quit date: 1980    Years since quitting: 43.4   Smokeless tobacco: Never  Vaping Use   Vaping Use: Never used  Substance and Sexual Activity   Alcohol use: Never   Drug use: Never   Sexual activity: Yes  Other Topics Concern   Not on file  Social History Narrative   Lives with husband, gardens daily.   Social Determinants of Health   Financial Resource Strain: Not on file  Food Insecurity: Not on file  Transportation Needs: Not on file  Physical Activity: Not on file  Stress: Not on file  Social Connections: Not on file  Intimate Partner Violence: Not on file    Past Surgical History:  Procedure Laterality Date   23 GAUGE PARS PLANA VITRECTOMY WITH 23 GAUGE MVR PORT     23 GAUGE PARS PLANA VITRECTOMY WITH 23 GAUGE MVR PORT     23 GAUGE PARS PLANA VITRECTOMY  WITH 23 GAUGE MVR PORT     23 GAUGE PARS PLANA VITRECTOMY WITH 23 GAUGE MVR PORT     23 GAUGE PARS PLANA VITRECTOMY WITH 23 GAUGE MVR PORT     23 GAUGE PARS PLANA VITRECTOMY WITH 23 GAUGE MVR PORT     23 GAUGE PARS PLANA VITRECTOMY WITH 23 GAUGE MVR PORT     23 GAUGE PARS PLANA VITRECTOMY WITH 23 GAUGE MVR PORT     23 GAUGE PARS PLANA VITRECTOMY WITH 23 GAUGE MVR PORT     23 GAUGE PARS PLANA VITRECTOMY WITH 23 GAUGE MVR PORT     23 GAUGE PARS PLANA VITRECTOMY WITH 23 GAUGE MVR PORT     23 GAUGE PARS PLANA VITRECTOMY WITH 23 GAUGE MVR PORT     23 GAUGE PARS PLANA VITRECTOMY WITH 23 GAUGE MVR PORT     23 GAUGE PARS PLANA VITRECTOMY WITH 23 GAUGE MVR PORT     23 GAUGE PARS PLANA VITRECTOMY WITH 23 GAUGE MVR PORT     23 GAUGE PARS PLANA VITRECTOMY WITH 23 GAUGE MVR PORT     23 GAUGE PARS PLANA VITRECTOMY WITH 23 GAUGE MVR PORT     23 GAUGE PARS PLANA VITRECTOMY WITH 23 GAUGE MVR PORT     23 GAUGE PARS PLANA VITRECTOMY WITH 23 GAUGE  MVR PORT     23 GAUGE PARS PLANA VITRECTOMY WITH 23 GAUGE MVR PORT     23 GAUGE PARS PLANA VITRECTOMY WITH 23 GAUGE MVR PORT     23 GAUGE PARS PLANA VITRECTOMY WITH 23 GAUGE MVR PORT     23 GAUGE PARS PLANA VITRECTOMY WITH 23 GAUGE MVR PORT     23 GAUGE PARS PLANA VITRECTOMY WITH 23 GAUGE MVR PORT     23 GAUGE PARS PLANA VITRECTOMY WITH 23 GAUGE MVR PORT     23 GAUGE PARS PLANA VITRECTOMY WITH 23 GAUGE MVR PORT     23 GAUGE PARS PLANA VITRECTOMY WITH 23 GAUGE MVR PORT     23 GAUGE PARS PLANA VITRECTOMY WITH 23 GAUGE MVR PORT     23 GAUGE PARS PLANA VITRECTOMY WITH 23 GAUGE MVR PORT     23 GAUGE PARS PLANA VITRECTOMY WITH 23 GAUGE MVR PORT     23 GAUGE PARS PLANA VITRECTOMY WITH 23 GAUGE MVR PORT     23 GAUGE PARS PLANA VITRECTOMY WITH 23 GAUGE MVR PORT     23 GAUGE PARS PLANA VITRECTOMY WITH 23 GAUGE MVR PORT     23 GAUGE PARS PLANA VITRECTOMY WITH 23 GAUGE MVR PORT     23 GAUGE PARS PLANA VITRECTOMY WITH 23 GAUGE MVR PORT     23 GAUGE PARS PLANA VITRECTOMY WITH 23 GAUGE MVR PORT     23 GAUGE PARS PLANA VITRECTOMY WITH 23 GAUGE MVR PORT     23 GAUGE PARS PLANA VITRECTOMY WITH 23 GAUGE MVR PORT     23 GAUGE PARS PLANA VITRECTOMY WITH 23 GAUGE MVR PORT     23 GAUGE PARS PLANA VITRECTOMY WITH 23 GAUGE MVR PORT     23 GAUGE PARS PLANA VITRECTOMY WITH 23 GAUGE MVR PORT     23 GAUGE PARS PLANA VITRECTOMY WITH 23 GAUGE MVR PORT     23 GAUGE PARS PLANA VITRECTOMY WITH 23 GAUGE MVR PORT     23 GAUGE PARS PLANA VITRECTOMY WITH 23 GAUGE MVR PORT     23 GAUGE PARS PLANA VITRECTOMY WITH 23 GAUGE MVR PORT     23 GAUGE  PARS PLANA VITRECTOMY WITH 23 GAUGE MVR PORT     23 GAUGE PARS PLANA VITRECTOMY WITH 23 GAUGE MVR PORT     23 GAUGE PARS PLANA VITRECTOMY WITH 23 GAUGE MVR PORT     23 GAUGE PARS PLANA VITRECTOMY WITH 23 GAUGE MVR PORT     23 GAUGE PARS PLANA VITRECTOMY WITH 23 GAUGE MVR PORT     23 GAUGE PARS PLANA VITRECTOMY WITH 23 GAUGE MVR PORT     23 GAUGE PARS PLANA VITRECTOMY WITH 23 GAUGE  MVR PORT     23 GAUGE PARS PLANA VITRECTOMY WITH 23 GAUGE MVR PORT     23 GAUGE PARS PLANA VITRECTOMY WITH 23 GAUGE MVR PORT     23 GAUGE PARS PLANA VITRECTOMY WITH 23 GAUGE MVR PORT     23 GAUGE PARS PLANA VITRECTOMY WITH 23 GAUGE MVR PORT     23 GAUGE PARS PLANA VITRECTOMY WITH 23 GAUGE MVR PORT     23 GAUGE PARS PLANA VITRECTOMY WITH 23 GAUGE MVR PORT     23 GAUGE PARS PLANA VITRECTOMY WITH 23 GAUGE MVR PORT     23 GAUGE PARS PLANA VITRECTOMY WITH 23 GAUGE MVR PORT     23 GAUGE PARS PLANA VITRECTOMY WITH 23 GAUGE MVR PORT     23 GAUGE PARS PLANA VITRECTOMY WITH 23 GAUGE MVR PORT     23 GAUGE PARS PLANA VITRECTOMY WITH 23 GAUGE MVR PORT     23 GAUGE PARS PLANA VITRECTOMY WITH 23 GAUGE MVR PORT     23 GAUGE PARS PLANA VITRECTOMY WITH 23 GAUGE MVR PORT     23 GAUGE PARS PLANA VITRECTOMY WITH 23 GAUGE MVR PORT     23 GAUGE PARS PLANA VITRECTOMY WITH 23 GAUGE MVR PORT     23 GAUGE PARS PLANA VITRECTOMY WITH 23 GAUGE MVR PORT     23 GAUGE PARS PLANA VITRECTOMY WITH 23 GAUGE MVR PORT     23 GAUGE PARS PLANA VITRECTOMY WITH 23 GAUGE MVR PORT     23 GAUGE PARS PLANA VITRECTOMY WITH 23 GAUGE MVR PORT     23 GAUGE PARS PLANA VITRECTOMY WITH 23 GAUGE MVR PORT     23 GAUGE PARS PLANA VITRECTOMY WITH 23 GAUGE MVR PORT     23 GAUGE PARS PLANA VITRECTOMY WITH 23 GAUGE MVR PORT     23 GAUGE PARS PLANA VITRECTOMY WITH 23 GAUGE MVR PORT     23 GAUGE PARS PLANA VITRECTOMY WITH 23 GAUGE MVR PORT     23 GAUGE PARS PLANA VITRECTOMY WITH 23 GAUGE MVR PORT     23 GAUGE PARS PLANA VITRECTOMY WITH 23 GAUGE MVR PORT     23 GAUGE PARS PLANA VITRECTOMY WITH 23 GAUGE MVR PORT     23 GAUGE PARS PLANA VITRECTOMY WITH 23 GAUGE MVR PORT     23 GAUGE PARS PLANA VITRECTOMY WITH 23 GAUGE MVR PORT     23 GAUGE PARS PLANA VITRECTOMY WITH 23 GAUGE MVR PORT     23 GAUGE PARS PLANA VITRECTOMY WITH 23 GAUGE MVR PORT     23 GAUGE PARS PLANA VITRECTOMY WITH 23 GAUGE MVR PORT     23 GAUGE PARS PLANA VITRECTOMY WITH 23 GAUGE  MVR PORT     23 GAUGE PARS PLANA VITRECTOMY WITH 23 GAUGE MVR PORT     23 GAUGE PARS PLANA VITRECTOMY WITH 23 GAUGE MVR PORT     23 GAUGE PARS PLANA VITRECTOMY WITH 23 GAUGE MVR PORT  23 GAUGE PARS PLANA VITRECTOMY WITH 23 GAUGE MVR PORT     ABDOMINAL HYSTERECTOMY     APPENDECTOMY     CATARACT EXTRACTION W/PHACO Left 06/16/2018   Procedure: CATARACT EXTRACTION PHACO AND INTRAOCULAR LENS PLACEMENT (IOC);  Surgeon: Galen Manila, MD;  Location: [facility] ORS;  Service: Ophthalmology;  Laterality: Left;  [place] 00:45.6 AP% 19.7 CDE 8.96 Fluid Pack lot # [id number]   CHOLECYSTECTOMY     COLON SURGERY     long time ago. can't remember why or what   COLONOSCOPY     EYE SURGERY Bilateral 2020   cataract extractions   JOINT REPLACEMENT Right 2010   TKR   TONSILLECTOMY      History reviewed. No pertinent family history.  No Known Allergies     Latest Ref Rng & Units 03/27/2022    7:45 AM 03/25/2022    5:27 AM 03/24/2022    4:15 AM  CBC  WBC 4.0 - 10.5 K/uL 11.3   12.2   17.6    Hemoglobin 12.0 - 15.0 g/dL 45.4   09.8   11.9    Hematocrit 36.0 - 46.0 % 35.1   34.5   36.0    Platelets 150 - 400 K/uL 385   320   314        CMP     Component Value Date/Time   NA 137 03/27/2022 0745   NA 142 09/16/2012 0346   K 4.0 03/27/2022 0745   K 3.8 09/16/2012 0346   CL 105 03/27/2022 0745   CL 108 (H) 09/16/2012 0346   CO2 23 03/27/2022 0745   CO2 26 09/16/2012 0346   GLUCOSE 108 (H) 03/27/2022 0745   GLUCOSE 96 09/16/2012 0346   BUN 16 03/27/2022 0745   BUN 11 09/16/2012 0346   CREATININE 0.96 03/27/2022 0745   CREATININE 0.72 09/16/2012 0346   CALCIUM 8.8 (L) 03/27/2022 0745   CALCIUM 8.2 (L) 09/16/2012 0346   PROT 8.8 (H) 03/22/2022 2047   ALBUMIN 4.0 03/22/2022 2047   AST 23 03/22/2022 2047   ALT 16 03/22/2022 2047   ALKPHOS 86 03/22/2022 2047   BILITOT 1.4 (H) 03/22/2022 2047   GFRNONAA 58 (L) 03/27/2022 0745   GFRNONAA >60 09/16/2012 0346   GFRAA 51 (L) 03/24/2018 2318   GFRAA  >60 09/16/2012 0346     No results found.     Assessment & Plan:   1. Cellulitis of left lower extremity The patient and family note that since her treatment of cellulitis has resolved the swelling is mostly controlled.  They are advised to continue with use of medical grade compression socks.  She should also elevate her lower extremities when possible as well as be active 4 to 5 days/week.  We will have patient return in 3 months to determine if there is a possible underlying venous insufficiency resulting in her swelling and cellulitis.  2. Stage 3a chronic kidney disease (HCC) Possibly contributing factor to her swelling.    Current Outpatient Medications on File Prior to Visit  Medication Sig Dispense Refill   acetaminophen (TYLENOL) 325 MG tablet Take 650 mg by mouth every 6 (six) hours as needed for moderate pain.     busPIRone (BUSPAR) 15 MG tablet Take 15 mg by mouth 2 (two) times daily.      donepezil (ARICEPT) 10 MG tablet Take 10 mg by mouth daily.     famotidine (PEPCID) 20 MG tablet Take 20 mg by mouth 2 (two) times daily.  mirabegron ER (MYRBETRIQ) 50 MG TB24 tablet Take 1 tablet (50 mg total) by mouth daily. 30 tablet 0   mirtazapine (REMERON) 15 MG tablet Take 15 mg by mouth at bedtime.     Multiple Vitamin (MULTIVITAMIN WITH MINERALS) TABS tablet Take 1 tablet by mouth daily.     sertraline (ZOLOFT) 100 MG tablet Take 100 mg by mouth daily.     naproxen sodium (ALEVE) 220 MG tablet Take 220-440 mg by mouth daily as needed (arthritis).  (Patient not taking: Reported on 04/19/2022)     oxyCODONE-acetaminophen (PERCOCET) 5-325 MG tablet Take 1 tablet by mouth every 4 (four) hours as needed for severe pain. (Patient not taking: Reported on 04/02/2022) 12 tablet 0   No current facility-administered medications on file prior to visit.    There are no Patient Instructions on file for this visit. No follow-ups on file.   Georgiana Spinner, NP

## 2022-05-09 ENCOUNTER — Ambulatory Visit: Payer: Medicare Other | Attending: Urology

## 2022-05-09 DIAGNOSIS — M6281 Muscle weakness (generalized): Secondary | ICD-10-CM | POA: Insufficient documentation

## 2022-05-09 DIAGNOSIS — R278 Other lack of coordination: Secondary | ICD-10-CM | POA: Insufficient documentation

## 2022-05-09 DIAGNOSIS — R32 Unspecified urinary incontinence: Secondary | ICD-10-CM | POA: Insufficient documentation

## 2022-05-09 NOTE — Therapy (Signed)
OUTPATIENT PHYSICAL THERAPY FEMALE PELVIC EVALUATION   Patient Name: Melissa Anthony MRN: [id number] DOB:1934-02-16, 88 y.o., female Today's Date: 05/09/2022   PT End of Session - 05/09/22 1055     Visit Number 1    Number of Visits 1    Date for PT Re-Evaluation --    Authorization Type IE: 05/09/22    PT Start Time 1100    PT Stop Time 1143    PT Time Calculation (min) 43 min    Activity Tolerance Patient tolerated treatment well             Past Medical History:  Diagnosis Date   Anxiety    Arthritis    Depression    GERD (gastroesophageal reflux disease)    Past Surgical History:  Procedure Laterality Date   23 GAUGE PARS PLANA VITRECTOMY WITH 23 GAUGE MVR PORT     23 GAUGE PARS PLANA VITRECTOMY WITH 23 GAUGE MVR PORT     23 GAUGE PARS PLANA VITRECTOMY WITH 23 GAUGE MVR PORT     23 GAUGE PARS PLANA VITRECTOMY WITH 23 GAUGE MVR PORT     23 GAUGE PARS PLANA VITRECTOMY WITH 23 GAUGE MVR PORT     23 GAUGE PARS PLANA VITRECTOMY WITH 23 GAUGE MVR PORT     23 GAUGE PARS PLANA VITRECTOMY WITH 23 GAUGE MVR PORT     23 GAUGE PARS PLANA VITRECTOMY WITH 23 GAUGE MVR PORT     23 GAUGE PARS PLANA VITRECTOMY WITH 23 GAUGE MVR PORT     23 GAUGE PARS PLANA VITRECTOMY WITH 23 GAUGE MVR PORT     23 GAUGE PARS PLANA VITRECTOMY WITH 23 GAUGE MVR PORT     23 GAUGE PARS PLANA VITRECTOMY WITH 23 GAUGE MVR PORT     23 GAUGE PARS PLANA VITRECTOMY WITH 23 GAUGE MVR PORT     23 GAUGE PARS PLANA VITRECTOMY WITH 23 GAUGE MVR PORT     23 GAUGE PARS PLANA VITRECTOMY WITH 23 GAUGE MVR PORT     23 GAUGE PARS PLANA VITRECTOMY WITH 23 GAUGE MVR PORT     23 GAUGE PARS PLANA VITRECTOMY WITH 23 GAUGE MVR PORT     23 GAUGE PARS PLANA VITRECTOMY WITH 23 GAUGE MVR PORT     23 GAUGE PARS PLANA VITRECTOMY WITH 23 GAUGE MVR PORT     23 GAUGE PARS PLANA VITRECTOMY WITH 23 GAUGE MVR PORT     23 GAUGE PARS PLANA VITRECTOMY WITH 23 GAUGE MVR PORT     23 GAUGE PARS PLANA VITRECTOMY WITH 23 GAUGE MVR PORT      23 GAUGE PARS PLANA VITRECTOMY WITH 23 GAUGE MVR PORT     23 GAUGE PARS PLANA VITRECTOMY WITH 23 GAUGE MVR PORT     23 GAUGE PARS PLANA VITRECTOMY WITH 23 GAUGE MVR PORT     23 GAUGE PARS PLANA VITRECTOMY WITH 23 GAUGE MVR PORT     23 GAUGE PARS PLANA VITRECTOMY WITH 23 GAUGE MVR PORT     23 GAUGE PARS PLANA VITRECTOMY WITH 23 GAUGE MVR PORT     23 GAUGE PARS PLANA VITRECTOMY WITH 23 GAUGE MVR PORT     23 GAUGE PARS PLANA VITRECTOMY WITH 23 GAUGE MVR PORT     23 GAUGE PARS PLANA VITRECTOMY WITH 23 GAUGE MVR PORT     23 GAUGE PARS PLANA VITRECTOMY WITH 23 GAUGE MVR PORT     23 GAUGE PARS PLANA VITRECTOMY WITH 23 GAUGE MVR PORT  23 GAUGE PARS PLANA VITRECTOMY WITH 23 GAUGE MVR PORT     23 GAUGE PARS PLANA VITRECTOMY WITH 23 GAUGE MVR PORT     23 GAUGE PARS PLANA VITRECTOMY WITH 23 GAUGE MVR PORT     23 GAUGE PARS PLANA VITRECTOMY WITH 23 GAUGE MVR PORT     23 GAUGE PARS PLANA VITRECTOMY WITH 23 GAUGE MVR PORT     23 GAUGE PARS PLANA VITRECTOMY WITH 23 GAUGE MVR PORT     23 GAUGE PARS PLANA VITRECTOMY WITH 23 GAUGE MVR PORT     23 GAUGE PARS PLANA VITRECTOMY WITH 23 GAUGE MVR PORT     23 GAUGE PARS PLANA VITRECTOMY WITH 23 GAUGE MVR PORT     23 GAUGE PARS PLANA VITRECTOMY WITH 23 GAUGE MVR PORT     23 GAUGE PARS PLANA VITRECTOMY WITH 23 GAUGE MVR PORT     23 GAUGE PARS PLANA VITRECTOMY WITH 23 GAUGE MVR PORT     23 GAUGE PARS PLANA VITRECTOMY WITH 23 GAUGE MVR PORT     23 GAUGE PARS PLANA VITRECTOMY WITH 23 GAUGE MVR PORT     23 GAUGE PARS PLANA VITRECTOMY WITH 23 GAUGE MVR PORT     23 GAUGE PARS PLANA VITRECTOMY WITH 23 GAUGE MVR PORT     23 GAUGE PARS PLANA VITRECTOMY WITH 23 GAUGE MVR PORT     23 GAUGE PARS PLANA VITRECTOMY WITH 23 GAUGE MVR PORT     23 GAUGE PARS PLANA VITRECTOMY WITH 23 GAUGE MVR PORT     23 GAUGE PARS PLANA VITRECTOMY WITH 23 GAUGE MVR PORT     23 GAUGE PARS PLANA VITRECTOMY WITH 23 GAUGE MVR PORT     23 GAUGE PARS PLANA VITRECTOMY WITH 23 GAUGE MVR PORT     23  GAUGE PARS PLANA VITRECTOMY WITH 23 GAUGE MVR PORT     23 GAUGE PARS PLANA VITRECTOMY WITH 23 GAUGE MVR PORT     23 GAUGE PARS PLANA VITRECTOMY WITH 23 GAUGE MVR PORT     23 GAUGE PARS PLANA VITRECTOMY WITH 23 GAUGE MVR PORT     23 GAUGE PARS PLANA VITRECTOMY WITH 23 GAUGE MVR PORT     23 GAUGE PARS PLANA VITRECTOMY WITH 23 GAUGE MVR PORT     23 GAUGE PARS PLANA VITRECTOMY WITH 23 GAUGE MVR PORT     23 GAUGE PARS PLANA VITRECTOMY WITH 23 GAUGE MVR PORT     23 GAUGE PARS PLANA VITRECTOMY WITH 23 GAUGE MVR PORT     23 GAUGE PARS PLANA VITRECTOMY WITH 23 GAUGE MVR PORT     23 GAUGE PARS PLANA VITRECTOMY WITH 23 GAUGE MVR PORT     23 GAUGE PARS PLANA VITRECTOMY WITH 23 GAUGE MVR PORT     23 GAUGE PARS PLANA VITRECTOMY WITH 23 GAUGE MVR PORT     23 GAUGE PARS PLANA VITRECTOMY WITH 23 GAUGE MVR PORT     23 GAUGE PARS PLANA VITRECTOMY WITH 23 GAUGE MVR PORT     23 GAUGE PARS PLANA VITRECTOMY WITH 23 GAUGE MVR PORT     23 GAUGE PARS PLANA VITRECTOMY WITH 23 GAUGE MVR PORT     23 GAUGE PARS PLANA VITRECTOMY WITH 23 GAUGE MVR PORT     23 GAUGE PARS PLANA VITRECTOMY WITH 23 GAUGE MVR PORT     23 GAUGE PARS PLANA VITRECTOMY WITH 23 GAUGE MVR PORT     23 GAUGE PARS PLANA VITRECTOMY WITH 23 GAUGE MVR PORT  [place] VITRECTOMY WITH 23 GAUGE MVR PORT     23 GAUGE PARS PLANA VITRECTOMY WITH 23 GAUGE MVR PORT     23 GAUGE PARS PLANA VITRECTOMY WITH 23 GAUGE MVR PORT     23 GAUGE PARS PLANA VITRECTOMY WITH 23 GAUGE MVR PORT     23 GAUGE PARS PLANA VITRECTOMY WITH 23 GAUGE MVR PORT     23 GAUGE PARS PLANA VITRECTOMY WITH 23 GAUGE MVR PORT     23 GAUGE PARS PLANA VITRECTOMY WITH 23 GAUGE MVR PORT     23 GAUGE PARS PLANA VITRECTOMY WITH 23 GAUGE MVR PORT     23 GAUGE PARS PLANA VITRECTOMY WITH 23 GAUGE MVR PORT     23 GAUGE PARS PLANA VITRECTOMY WITH 23 GAUGE MVR PORT     23 GAUGE PARS PLANA VITRECTOMY WITH 23 GAUGE MVR PORT     23 GAUGE PARS PLANA VITRECTOMY WITH 23 GAUGE MVR PORT     23  GAUGE PARS PLANA VITRECTOMY WITH 23 GAUGE MVR PORT     ABDOMINAL HYSTERECTOMY     APPENDECTOMY     CATARACT EXTRACTION W/PHACO Left 06/16/2018   Procedure: CATARACT EXTRACTION PHACO AND INTRAOCULAR LENS PLACEMENT (IOC);  Surgeon: Birder Robson, MD;  Location: [facility] ORS;  Service: Ophthalmology;  Laterality: Left;  [place] 00:45.6 AP% 19.7 CDE 8.96 Fluid Pack lot # [id number]   CHOLECYSTECTOMY     COLON SURGERY     long time ago. can't remember why or what   COLONOSCOPY     EYE SURGERY Bilateral 2020   cataract extractions   JOINT REPLACEMENT Right 2010   TKR   TONSILLECTOMY     Patient Active Problem List   Diagnosis Date Noted   Cellulitis of left lower extremity 03/23/2022   GERD (gastroesophageal reflux disease)    Depression    AKI (acute kidney injury) ([place])    Urinary incontinence    Dementia without behavioral disturbance ([place])    Stage 3a chronic kidney disease ([place]) 04/02/2021   CAP (community acquired pneumonia) 03/24/2018   Menopausal and postmenopausal disorder 03/17/2015   Major depression in remission ([place]) 09/15/2014   Hx of adenomatous colonic polyps 06/22/2014   OA (osteoarthritis) 05/07/2014   Hyperlipidemia 05/07/2014    PCP: Frazier Richards, MD  REFERRING PROVIDER: John Giovanni, MD  REFERRING DIAG: R32 (ICD-10-CM) - Urinary incontinence, unspecified type  THERAPY DIAG:  Muscle weakness (generalized)  Other lack of coordination  Rationale for Evaluation and Treatment: Rehabilitation  ONSET DATE: Patient cannot recall   RED FLAGS: N/A  Have you had any night sweats? Unexplained weight loss? Saddle anesthesia? Unexplained changes in bowel or bladder habits?   SUBJECTIVE: Patient confirms identification and approves PT to assess pelvic floor and treatment Yes  PRECAUTIONS: None  WEIGHT BEARING RESTRICTIONS: No  FALLS:  Has patient fallen in last 6 months? Yes. Number of falls 1, trying to grab something from the cabinets on the stool and fell   OCCUPATION/SOCIAL ACTIVITIES: Gardening, being outside, going to church   PLOF: Independent  PERTINENT HISTORY/CHART REVIEW: "Dementia without behavorial disturbance" and "slowly progressive"   CHIEF COMPLAINT: Beginning of session, Pt is AxOx3 as Pt did not know the exact reason she was coming to physical therapy and stated "it's my daughters doing." Continued to ask questions regarding medication and medical history but Pt does not remember what medicine she takes and stated "overall good" in health. Regarding urinary leakage, Pt perseverated on "Its not as bad as it was" and "I drink a lot of sweet tea, I like my sweet tea." Pt unable to pinpoint when her urinary leakage starts and that it was "bad" about 2 months ago, but its not anymore, "I wear a pad just in case." Due to Pt being a poor historian, daughter, Jeannene Patella, was asked to join therapy session. Before going into the room, Pam stated to DPT that the Pt has been aggressive lately and has problems with memory. In regards to the leakage, the Pt has had bedwetting episodes. Pt does recall bedwetting episodes but "that happened awhile ago." Pt reports no more episodes of bedwetting and urinary leakage. DPT unable to pinpoint exact numbers of leakage episodes or when they occur as Pt is poor historian and lives alone. After discussion on what to expect in pelvic floor PT and the length of treatment to see improvement, Pt stated "I don't know" when asked if she would like to continue physical therapy. Pt denied there being a problem with her urinary leakage and will continue to "wear a pad just in case."    PAIN:  Are you having pain? No   LIVING ENVIRONMENT: Lives with: lives alone   PATIENT GOALS: Pt did not verbally state any goals    UROLOGICAL  HISTORY Fluid intake: Yes: iced tea, diet coke, water  , Pt perseveration on this question  Pain with urination: No Fully empty bladder: Yes Stream: Strong Urgency: No Frequency: "can't give you a number"  Leakage: Walking to the bathroom, while mowing the lawn (stated by daughter, Pam)  Pads: Yes Type: Avant, Type 1 (had one in her purse) Amount: 1-2x/day  GASTROINTESTINAL HISTORY Pt has no concerns   SEXUAL HISTORY/FUNCTION Pt has no concerns    OBSTETRICAL HISTORY Vaginal deliveries: G1P1 Tearing: Yes: episiotomy C-section deliveries: no Currently pregnant: No  GYNECOLOGICAL HISTORY Hysterectomy: yes, abdominal (char review)   OBJECTIVE:   DIAGNOSTIC TESTING/IMPRESSIONS:  3/16 Dr. Bernardo Heater, MD Assessment/ Plan: No abnormalities on cystoscopy or upper tract imaging  COGNITION: Overall cognitive status: Impaired, History of cognitive impairments - at baseline, and daughter Pam served as historian for some questions, although Pt lives alone and daughter cannot answer when the incontinence episodes happen exactly      Patient Education:  Patient and daughter Jeannene Patella) educated on what to expect during course of physical therapy, a normal POC, and how the PFM are involved in bowel/bladder habits as well as deep core musculature and breathing mechanics. Patient verbalized understanding . Patient will benefit from further education in order to maximize compliance and understanding for long-term therapeutic gains.    ASSESSMENT:  Clinical Impression: Patient is a 88 y.o. who was seen today for physical therapy evaluation and treatment for a chief complaint of urinary leakage. Pt is poor  historian and due to cognitive deficits is unable to fully answer questions about urinary incontinence episodes without perseveration on enjoying the outdoors and the leakage "not being as bad, I just wear a pad." Pt lives alone but accompanied by daughter, Jeannene Patella. DPT asked Pam to step into the  session for increased support. According to notes from Dr. Bernardo Heater and Jeannene Patella, Pt has had episodes of incontinence at night and the Pt herself stated "it was running down my legs" a few months ago, but believes the problem has solved. Today's evaluation suggest deficits in PFM coordination, PFM strength, IAP management, and posture as evidenced by episodes of urinary leakage at night, increased intake of bladder irritants, and urinary urgency intermittently; however, Pt has expressed that the problem has resolved. At this time, pelvic floor physical therapy is not indicated 2/2 to cognitive ability to maintain an HEP, presence of cognitive decline, and lack of motivation to participate. DPT stated to Pt and daughter to return if the problem worsens and both are in agreement.   Objective Impairments: decreased cognition, decreased coordination, decreased knowledge of condition, decreased strength, decreased safety awareness, and postural dysfunction.   Activity Limitations: sleeping, continence, and hygiene/grooming  Personal Factors: Age, Behavior pattern, Past/current experiences, Time since onset of injury/illness/exacerbation, and 1-2 comorbidities: memory deficit, Stage 3a chronic kidney disease  are also affecting patient's functional outcome.   Rehab Potential: Fair (see clinical impression)  Clinical Decision Making: Evolving/moderate complexity  Evaluation Complexity: Moderate   GOALS: Goals reviewed with patient? No  SHORT TERM GOALS:   Patient will be able to verbalize understanding of the functions of the pelvic floor in relation to bowel/bladder habits, sexual function and posture.  Goal status: MET    PLAN: One-time visit for initial evaluation    Khaliyah Northrop, PT, DPT  05/09/2022, 3:02 PM

## 2022-07-18 ENCOUNTER — Other Ambulatory Visit (INDEPENDENT_AMBULATORY_CARE_PROVIDER_SITE_OTHER): Payer: Self-pay | Admitting: Nurse Practitioner

## 2022-07-18 DIAGNOSIS — L03116 Cellulitis of left lower limb: Secondary | ICD-10-CM

## 2022-07-18 DIAGNOSIS — M7989 Other specified soft tissue disorders: Secondary | ICD-10-CM

## 2022-07-23 ENCOUNTER — Ambulatory Visit (INDEPENDENT_AMBULATORY_CARE_PROVIDER_SITE_OTHER): Payer: Medicare Other

## 2022-07-23 ENCOUNTER — Ambulatory Visit (INDEPENDENT_AMBULATORY_CARE_PROVIDER_SITE_OTHER): Payer: Medicare Other | Admitting: Vascular Surgery

## 2022-07-23 ENCOUNTER — Encounter (INDEPENDENT_AMBULATORY_CARE_PROVIDER_SITE_OTHER): Payer: Self-pay | Admitting: Vascular Surgery

## 2022-07-23 VITALS — BP 162/61 | HR 73 | Resp 17 | Ht 65.0 in | Wt 159.8 lb

## 2022-07-23 DIAGNOSIS — M7989 Other specified soft tissue disorders: Secondary | ICD-10-CM

## 2022-07-23 DIAGNOSIS — N1831 Chronic kidney disease, stage 3a: Secondary | ICD-10-CM | POA: Diagnosis not present

## 2022-07-23 DIAGNOSIS — L03116 Cellulitis of left lower limb: Secondary | ICD-10-CM

## 2022-07-23 NOTE — Progress Notes (Signed)
MRN : [id number]  Melissa Anthony is a 88 y.o. (1934-06-30) female who presents with chief complaint of No chief complaint on file. Marland Kitchen  History of Present Illness: Patient returns today in follow up of previous cellulitis and leg pain and swelling.  Her swelling and pain in the left lower extremity have been much better and she has not had any significant problems over the past couple of months.  She has not really been wearing her compression socks but her leg has been swelling much.  She denies any wounds or infection. A reflux study was done today showing no evidence of DVT or superficial thrombophlebitis in the left lower extremity.  No significant venous reflux was identified in the left lower extremity either.  She did had some enlarged lymph nodes in the left groin as well as Baker's cyst behind her left knee.  Current Outpatient Medications  Medication Sig Dispense Refill   acetaminophen (TYLENOL) 325 MG tablet Take 650 mg by mouth every 6 (six) hours as needed for moderate pain.     busPIRone (BUSPAR) 15 MG tablet Take 15 mg by mouth 2 (two) times daily.      donepezil (ARICEPT) 10 MG tablet Take 10 mg by mouth daily.     famotidine (PEPCID) 20 MG tablet Take 20 mg by mouth 2 (two) times daily.     mirtazapine (REMERON) 15 MG tablet Take 15 mg by mouth at bedtime.     Multiple Vitamin (MULTIVITAMIN WITH MINERALS) TABS tablet Take 1 tablet by mouth daily.     naproxen sodium (ALEVE) 220 MG tablet Take 220-440 mg by mouth daily as needed (arthritis).     sertraline (ZOLOFT) 100 MG tablet Take 100 mg by mouth daily.     mirabegron ER (MYRBETRIQ) 50 MG TB24 tablet Take 1 tablet (50 mg total) by mouth daily. (Patient not taking: Reported on 07/23/2022) 30 tablet 0   oxyCODONE-acetaminophen (PERCOCET) 5-325 MG tablet Take 1 tablet by mouth every 4 (four) hours as needed for severe pain. (Patient not taking: Reported on 04/02/2022) 12 tablet 0   No current facility-administered medications for  this visit.    Past Medical History:  Diagnosis Date   Anxiety    Arthritis    Depression    GERD (gastroesophageal reflux disease)     Past Surgical History:  Procedure Laterality Date   23 GAUGE PARS PLANA VITRECTOMY WITH 23 GAUGE MVR PORT     23 GAUGE PARS PLANA VITRECTOMY WITH 23 GAUGE MVR PORT     23 GAUGE PARS PLANA VITRECTOMY WITH 23 GAUGE MVR PORT     23 GAUGE PARS PLANA VITRECTOMY WITH 23 GAUGE MVR PORT     23 GAUGE PARS PLANA VITRECTOMY WITH 23 GAUGE MVR PORT     23 GAUGE PARS PLANA VITRECTOMY WITH 23 GAUGE MVR PORT     23 GAUGE PARS PLANA VITRECTOMY WITH 23 GAUGE MVR PORT     23 GAUGE PARS PLANA VITRECTOMY WITH 23 GAUGE MVR PORT     23 GAUGE PARS PLANA VITRECTOMY WITH 23 GAUGE MVR PORT     23 GAUGE PARS PLANA VITRECTOMY WITH 23 GAUGE MVR PORT     23 GAUGE PARS PLANA VITRECTOMY WITH 23 GAUGE MVR PORT     23 GAUGE PARS PLANA VITRECTOMY WITH 23 GAUGE MVR PORT     23 GAUGE PARS PLANA VITRECTOMY WITH 23 GAUGE MVR PORT     23 GAUGE PARS PLANA VITRECTOMY WITH 23 GAUGE MVR  PORT     23 GAUGE PARS PLANA VITRECTOMY WITH 23 GAUGE MVR PORT     23 GAUGE PARS PLANA VITRECTOMY WITH 23 GAUGE MVR PORT     23 GAUGE PARS PLANA VITRECTOMY WITH 23 GAUGE MVR PORT     23 GAUGE PARS PLANA VITRECTOMY WITH 23 GAUGE MVR PORT     23 GAUGE PARS PLANA VITRECTOMY WITH 23 GAUGE MVR PORT     23 GAUGE PARS PLANA VITRECTOMY WITH 23 GAUGE MVR PORT     23 GAUGE PARS PLANA VITRECTOMY WITH 23 GAUGE MVR PORT     23 GAUGE PARS PLANA VITRECTOMY WITH 23 GAUGE MVR PORT     23 GAUGE PARS PLANA VITRECTOMY WITH 23 GAUGE MVR PORT     23 GAUGE PARS PLANA VITRECTOMY WITH 23 GAUGE MVR PORT     23 GAUGE PARS PLANA VITRECTOMY WITH 23 GAUGE MVR PORT     23 GAUGE PARS PLANA VITRECTOMY WITH 23 GAUGE MVR PORT     23 GAUGE PARS PLANA VITRECTOMY WITH 23 GAUGE MVR PORT     23 GAUGE PARS PLANA VITRECTOMY WITH 23 GAUGE MVR PORT     23 GAUGE PARS PLANA VITRECTOMY WITH 23 GAUGE MVR PORT     23 GAUGE PARS PLANA VITRECTOMY WITH  23 GAUGE MVR PORT     23 GAUGE PARS PLANA VITRECTOMY WITH 23 GAUGE MVR PORT     23 GAUGE PARS PLANA VITRECTOMY WITH 23 GAUGE MVR PORT     23 GAUGE PARS PLANA VITRECTOMY WITH 23 GAUGE MVR PORT     23 GAUGE PARS PLANA VITRECTOMY WITH 23 GAUGE MVR PORT     23 GAUGE PARS PLANA VITRECTOMY WITH 23 GAUGE MVR PORT     23 GAUGE PARS PLANA VITRECTOMY WITH 23 GAUGE MVR PORT     23 GAUGE PARS PLANA VITRECTOMY WITH 23 GAUGE MVR PORT     23 GAUGE PARS PLANA VITRECTOMY WITH 23 GAUGE MVR PORT     23 GAUGE PARS PLANA VITRECTOMY WITH 23 GAUGE MVR PORT     23 GAUGE PARS PLANA VITRECTOMY WITH 23 GAUGE MVR PORT     23 GAUGE PARS PLANA VITRECTOMY WITH 23 GAUGE MVR PORT     23 GAUGE PARS PLANA VITRECTOMY WITH 23 GAUGE MVR PORT     23 GAUGE PARS PLANA VITRECTOMY WITH 23 GAUGE MVR PORT     23 GAUGE PARS PLANA VITRECTOMY WITH 23 GAUGE MVR PORT     23 GAUGE PARS PLANA VITRECTOMY WITH 23 GAUGE MVR PORT     23 GAUGE PARS PLANA VITRECTOMY WITH 23 GAUGE MVR PORT     23 GAUGE PARS PLANA VITRECTOMY WITH 23 GAUGE MVR PORT     23 GAUGE PARS PLANA VITRECTOMY WITH 23 GAUGE MVR PORT     23 GAUGE PARS PLANA VITRECTOMY WITH 23 GAUGE MVR PORT     23 GAUGE PARS PLANA VITRECTOMY WITH 23 GAUGE MVR PORT     23 GAUGE PARS PLANA VITRECTOMY WITH 23 GAUGE MVR PORT     23 GAUGE PARS PLANA VITRECTOMY WITH 23 GAUGE MVR PORT     23 GAUGE PARS PLANA VITRECTOMY WITH 23 GAUGE MVR PORT     23 GAUGE PARS PLANA VITRECTOMY WITH 23 GAUGE MVR PORT     23 GAUGE PARS PLANA VITRECTOMY WITH 23 GAUGE MVR PORT     23 GAUGE PARS PLANA VITRECTOMY WITH 23 GAUGE MVR PORT     23 GAUGE PARS PLANA VITRECTOMY WITH  23 GAUGE MVR PORT     23 GAUGE PARS PLANA VITRECTOMY WITH 23 GAUGE MVR PORT     23 GAUGE PARS PLANA VITRECTOMY WITH 23 GAUGE MVR PORT     23 GAUGE PARS PLANA VITRECTOMY WITH 23 GAUGE MVR PORT     23 GAUGE PARS PLANA VITRECTOMY WITH 23 GAUGE MVR PORT     23 GAUGE PARS PLANA VITRECTOMY WITH 23 GAUGE MVR PORT     23 GAUGE PARS PLANA VITRECTOMY WITH 23  GAUGE MVR PORT     23 GAUGE PARS PLANA VITRECTOMY WITH 23 GAUGE MVR PORT     23 GAUGE PARS PLANA VITRECTOMY WITH 23 GAUGE MVR PORT     23 GAUGE PARS PLANA VITRECTOMY WITH 23 GAUGE MVR PORT     23 GAUGE PARS PLANA VITRECTOMY WITH 23 GAUGE MVR PORT     23 GAUGE PARS PLANA VITRECTOMY WITH 23 GAUGE MVR PORT     23 GAUGE PARS PLANA VITRECTOMY WITH 23 GAUGE MVR PORT     23 GAUGE PARS PLANA VITRECTOMY WITH 23 GAUGE MVR PORT     23 GAUGE PARS PLANA VITRECTOMY WITH 23 GAUGE MVR PORT     23 GAUGE PARS PLANA VITRECTOMY WITH 23 GAUGE MVR PORT     23 GAUGE PARS PLANA VITRECTOMY WITH 23 GAUGE MVR PORT     23 GAUGE PARS PLANA VITRECTOMY WITH 23 GAUGE MVR PORT     23 GAUGE PARS PLANA VITRECTOMY WITH 23 GAUGE MVR PORT     23 GAUGE PARS PLANA VITRECTOMY WITH 23 GAUGE MVR PORT     23 GAUGE PARS PLANA VITRECTOMY WITH 23 GAUGE MVR PORT     23 GAUGE PARS PLANA VITRECTOMY WITH 23 GAUGE MVR PORT     23 GAUGE PARS PLANA VITRECTOMY WITH 23 GAUGE MVR PORT     23 GAUGE PARS PLANA VITRECTOMY WITH 23 GAUGE MVR PORT     23 GAUGE PARS PLANA VITRECTOMY WITH 23 GAUGE MVR PORT     23 GAUGE PARS PLANA VITRECTOMY WITH 23 GAUGE MVR PORT     23 GAUGE PARS PLANA VITRECTOMY WITH 23 GAUGE MVR PORT     23 GAUGE PARS PLANA VITRECTOMY WITH 23 GAUGE MVR PORT     23 GAUGE PARS PLANA VITRECTOMY WITH 23 GAUGE MVR PORT     23 GAUGE PARS PLANA VITRECTOMY WITH 23 GAUGE MVR PORT     23 GAUGE PARS PLANA VITRECTOMY WITH 23 GAUGE MVR PORT     23 GAUGE PARS PLANA VITRECTOMY WITH 23 GAUGE MVR PORT     23 GAUGE PARS PLANA VITRECTOMY WITH 23 GAUGE MVR PORT     ABDOMINAL HYSTERECTOMY     APPENDECTOMY     CATARACT EXTRACTION W/PHACO Left 06/16/2018   Procedure: CATARACT EXTRACTION PHACO AND INTRAOCULAR LENS PLACEMENT (IOC);  Surgeon: Galen Manila, MD;  Location: [facility];  Service: Ophthalmology;  Laterality: Left;  [place] 00:45.6 AP% 19.7 CDE 8.96 Fluid Pack lot # [id number]   CHOLECYSTECTOMY     COLON SURGERY     long time ago. can't remember  why or what   COLONOSCOPY     EYE SURGERY Bilateral 2020   cataract extractions   JOINT REPLACEMENT Right 2010   TKR   TONSILLECTOMY       Social History   Tobacco Use   Smoking status: Former    Types: Cigarettes    Quit date: 1980    Years since quitting: 43.6   Smokeless tobacco: Never  Vaping Use   Vaping Use: Never used  Substance Use Topics   Alcohol use: Never   Drug use: Never       No family history on file.   No Known Allergies   REVIEW OF SYSTEMS (Negative unless checked)  Constitutional: [] Weight loss  [] Fever  [] Chills Cardiac: [] Chest pain   [] Chest pressure   [] Palpitations   [] Shortness of breath when laying flat   [] Shortness of breath at rest   [] Shortness of breath with exertion. Vascular:  [] Pain in legs with walking   [] Pain in legs at rest   [] Pain in legs when laying flat   [] Claudication   [] Pain in feet when walking  [] Pain in feet at rest  [] Pain in feet when laying flat   [] History of DVT   [] Phlebitis   [x] Swelling in legs   [] Varicose veins   [] Non-healing ulcers Pulmonary:   [] Uses home oxygen   [] Productive cough   [] Hemoptysis   [] Wheeze  [] COPD   [] Asthma Neurologic:  [] Dizziness  [] Blackouts   [] Seizures   [] History of stroke   [] History of TIA  [] Aphasia   [] Temporary blindness   [] Dysphagia   [] Weakness or numbness in arms   [] Weakness or numbness in legs Musculoskeletal:  [x] Arthritis   [] Joint swelling   [] Joint pain   [] Low back pain Hematologic:  [] Easy bruising  [] Easy bleeding   [] Hypercoagulable state   [] Anemic   Gastrointestinal:  [] Blood in stool   [] Vomiting blood  [x] Gastroesophageal reflux/heartburn   [] Abdominal pain Genitourinary:  [] Chronic kidney disease   [] Difficult urination  [] Frequent urination  [] Burning with urination   [] Hematuria Skin:  [] Rashes   [] Ulcers   [] Wounds Psychological:  [x] History of anxiety   [x]  History of major depression.  Physical Examination  BP (!) 162/61 (BP Location: Right Arm)    Pulse 73   Resp 17   Ht 5\' 5"  (1.651 m)   Wt 159 lb 12.8 oz (72.5 kg)   BMI 26.59 kg/m  Gen:  WD/WN, NAD.  Appears younger than stated age Head: [place]/AT, No temporalis wasting. Ear/Nose/Throat: Hearing grossly intact, nares w/o erythema or drainage Eyes: Conjunctiva clear. Sclera non-icteric Neck: Supple.  Trachea midline Pulmonary:  Good air movement, no use of accessory muscles.  Cardiac: RRR, no JVD Vascular:  Vessel Right Left  Radial Palpable Palpable           Musculoskeletal: M/S 5/5 throughout.  No deformity or atrophy.  No significant lower extremity edema. Neurologic: Sensation grossly intact in extremities.  Symmetrical.  Speech is fluent.  Psychiatric: Judgment intact, Mood & affect appropriate for pt's clinical situation. Dermatologic: No rashes or ulcers noted.  No cellulitis or open wounds.      Labs No results found for this or any previous visit (from the past 2160 hour(s)).  Radiology No results found.  Assessment/Plan  Cellulitis of left lower extremity No significant venous reflux was identified in the left lower extremity either.  She did had some enlarged lymph nodes in the left groin as well as Baker's cyst behind her left knee.  There does not appear to be any underlying vascular disease that needs intervention at this time.  She can wear her compression socks and elevate her legs as needed for symptomatic relief with swelling or pain.  Return on as-needed basis.  Stage 3a chronic kidney disease (HCC) Can worsen LE swelling.    , MD  07/23/2022 11:59 AM    This note was created with  Dragon medical transcription system.  Any errors from dictation are purely unintentional

## 2022-07-23 NOTE — Assessment & Plan Note (Signed)
No significant venous reflux was identified in the left lower extremity either.  She did had some enlarged lymph nodes in the left groin as well as Baker's cyst behind her left knee.  There does not appear to be any underlying vascular disease that needs intervention at this time.  She can wear her compression socks and elevate her legs as needed for symptomatic relief with swelling or pain.  Return on as-needed basis.

## 2022-07-23 NOTE — Assessment & Plan Note (Signed)
Can worsen LE swelling 

## 2023-07-22 ENCOUNTER — Ambulatory Visit: Payer: Self-pay | Admitting: Surgery

## 2023-07-22 NOTE — H&P (View-Only) (Signed)
Subjective:   CC: Basal cell carcinoma (BCC) of skin of left upper extremity including shoulder [C44.619]  HPI:  Melissa Anthony is a 89 y.o. female who was referred by Drinda Butts, * for evaluation of above. First noted a few weeks ago.  Asymptomatic, but growing in size.     Past Medical History:  has a past medical history of Benign neoplasm of colon, Cataract cortical, senile, Colon polyp (07/05/14), Colon polyps, GERD (gastroesophageal reflux disease), Osteoarthritis, and Pure hypercholesterolemia.  Past Surgical History:  has a past surgical history that includes Tonsillectomy; Cholecystectomy; Hysterectomy; Arthroscopy Shoulder (Right); Laparoscopic Polypectomy; colonoscopy (07/13/2009); Colonoscopy (07/05/14); and Arthroplasty Total Knee (Right).  Family History: family history includes Cancer in her father and mother; Colon cancer in her father; Osteoporosis (Thinning of bones) in her mother.  Social History:  reports that she has quit smoking. She has never used smokeless tobacco. She reports that she does not drink alcohol and does not use drugs.  Current Medications: has a current medication list which includes the following prescription(s): acetaminophen, aspirin, buspirone, donepezil, famotidine, mirtazapine, naproxen sodium, and sertraline.  Allergies:  No Known Allergies  ROS:  A 15 point review of systems was performed and pertinent positives and negatives noted in HPI   Objective:     BP 122/68   Pulse 83   Ht 157.5 cm (5' 2.01")   Wt 68.9 kg (151 lb 14.4 oz)   BMI 27.78 kg/m   Constitutional :  No distress, cooperative, alert  Lymphatics/Throat:  Supple with no lymphadenopathy  Respiratory:  Clear to auscultation bilaterally  Cardiovascular:  Regular rate and rhythm  Gastrointestinal: Soft, non-tender, non-distended, no organomegaly.  Musculoskeletal: Steady gait and movement  Skin: Cool and moist, left forearm, palmar aspect 3cm distal to antecubital  fossa is round, firm, erythematous, non-tender nodule measuring 3cm or so.  Psychiatric: Normal affect, non-agitated, not confused         LABS:  N/a   RADS: N/a  Assessment:      Basal cell carcinoma (BCC) of skin of left upper extremity including shoulder [C44.619]  Plan:     1. Basal cell carcinoma (BCC) of skin of left upper extremity including shoulder [C44.619] Discussed surgical excision.  Alternatives include continued observation.  Benefits include possible symptom relief, pathologic evaluation, improved cosmesis. Discussed the risk of surgery including recurrence, chronic pain, post-op infxn, poor cosmesis, poor/delayed wound healing, and possible re-operation to address said risks. The risks of general anesthetic, if used, includes MI, CVA, sudden death or even reaction to anesthetic medications also discussed.  Typical post-op recovery time of 3-5 days with possible activity restrictions were also discussed.  The patient verbalized understanding and all questions were answered to the patient's satisfaction.  2. Patient has elected to proceed with surgical treatment. Procedure will be scheduled. In OR due to size and location near elbow.  Hold aspirin 7days prior.  labs/images/medications/previous chart entries reviewed personally and relevant changes/updates noted above.

## 2023-07-22 NOTE — H&P (Signed)
Subjective:   CC: Basal cell carcinoma (BCC) of skin of left upper extremity including shoulder [C44.619]  HPI:  Melissa Anthony is a 89 y.o. female who was referred by Drinda Butts, * for evaluation of above. First noted a few weeks ago.  Asymptomatic, but growing in size.     Past Medical History:  has a past medical history of Benign neoplasm of colon, Cataract cortical, senile, Colon polyp (07/05/14), Colon polyps, GERD (gastroesophageal reflux disease), Osteoarthritis, and Pure hypercholesterolemia.  Past Surgical History:  has a past surgical history that includes Tonsillectomy; Cholecystectomy; Hysterectomy; Arthroscopy Shoulder (Right); Laparoscopic Polypectomy; colonoscopy (07/13/2009); Colonoscopy (07/05/14); and Arthroplasty Total Knee (Right).  Family History: family history includes Cancer in her father and mother; Colon cancer in her father; Osteoporosis (Thinning of bones) in her mother.  Social History:  reports that she has quit smoking. She has never used smokeless tobacco. She reports that she does not drink alcohol and does not use drugs.  Current Medications: has a current medication list which includes the following prescription(s): acetaminophen, aspirin, buspirone, donepezil, famotidine, mirtazapine, naproxen sodium, and sertraline.  Allergies:  No Known Allergies  ROS:  A 15 point review of systems was performed and pertinent positives and negatives noted in HPI   Objective:     BP 122/68   Pulse 83   Ht 157.5 cm (5' 2.01")   Wt 68.9 kg (151 lb 14.4 oz)   BMI 27.78 kg/m   Constitutional :  No distress, cooperative, alert  Lymphatics/Throat:  Supple with no lymphadenopathy  Respiratory:  Clear to auscultation bilaterally  Cardiovascular:  Regular rate and rhythm  Gastrointestinal: Soft, non-tender, non-distended, no organomegaly.  Musculoskeletal: Steady gait and movement  Skin: Cool and moist, left forearm, palmar aspect 3cm distal to antecubital  fossa is round, firm, erythematous, non-tender nodule measuring 3cm or so.  Psychiatric: Normal affect, non-agitated, not confused         LABS:  N/a   RADS: N/a  Assessment:      Basal cell carcinoma (BCC) of skin of left upper extremity including shoulder [C44.619]  Plan:     1. Basal cell carcinoma (BCC) of skin of left upper extremity including shoulder [C44.619] Discussed surgical excision.  Alternatives include continued observation.  Benefits include possible symptom relief, pathologic evaluation, improved cosmesis. Discussed the risk of surgery including recurrence, chronic pain, post-op infxn, poor cosmesis, poor/delayed wound healing, and possible re-operation to address said risks. The risks of general anesthetic, if used, includes MI, CVA, sudden death or even reaction to anesthetic medications also discussed.  Typical post-op recovery time of 3-5 days with possible activity restrictions were also discussed.  The patient verbalized understanding and all questions were answered to the patient's satisfaction.  2. Patient has elected to proceed with surgical treatment. Procedure will be scheduled. In OR due to size and location near elbow.  Hold aspirin 7days prior.  labs/images/medications/previous chart entries reviewed personally and relevant changes/updates noted above.

## 2023-07-31 ENCOUNTER — Encounter
Admission: RE | Admit: 2023-07-31 | Discharge: 2023-07-31 | Disposition: A | Discharge status: Home / Self Care | Payer: Medicare Other | Source: Ambulatory Visit | Attending: Surgery | Admitting: Surgery

## 2023-07-31 ENCOUNTER — Inpatient Hospital Stay
Admission: RE | Admit: 2023-07-31 | Discharge: 2023-07-31 | Disposition: A | Discharge status: Home / Self Care | Payer: Medicare Other | Source: Ambulatory Visit

## 2023-07-31 ENCOUNTER — Other Ambulatory Visit: Payer: Self-pay

## 2023-07-31 DIAGNOSIS — E7800 Pure hypercholesterolemia, unspecified: Secondary | ICD-10-CM

## 2023-07-31 DIAGNOSIS — Z01812 Encounter for preprocedural laboratory examination: Secondary | ICD-10-CM

## 2023-07-31 DIAGNOSIS — N1831 Chronic kidney disease, stage 3a: Secondary | ICD-10-CM

## 2023-07-31 HISTORY — DX: Unspecified dementia, unspecified severity, without behavioral disturbance, psychotic disturbance, mood disturbance, and anxiety: F03.90

## 2023-07-31 HISTORY — DX: Pneumonia, unspecified organism: J18.9

## 2023-07-31 HISTORY — DX: Chronic kidney disease, stage 3a: N18.31

## 2023-07-31 HISTORY — DX: Basal cell carcinoma of skin, unspecified: C44.91

## 2023-07-31 HISTORY — DX: Nonexudative age-related macular degeneration, unspecified eye, stage unspecified: H35.3190

## 2023-07-31 HISTORY — DX: Hyperlipidemia, unspecified: E78.5

## 2023-07-31 HISTORY — DX: Unspecified osteoarthritis, unspecified site: M19.90

## 2023-07-31 HISTORY — DX: Prediabetes: R73.03

## 2023-07-31 HISTORY — DX: Unspecified urinary incontinence: R32

## 2023-07-31 HISTORY — DX: COVID-19: U07.1

## 2023-07-31 HISTORY — DX: Cellulitis of left lower limb: L03.116

## 2023-07-31 NOTE — Pre-Procedure Instructions (Signed)
Multiple attempts made to reach patient today for her scheduled PAT call, message left with no return call, will reschedule for 08/01/23.

## 2023-07-31 NOTE — Patient Instructions (Addendum)
Your procedure is scheduled on:08/08/23 - Friday Report to the Registration Desk on the 1st floor of the [facility]. To find out your arrival time, please call [phone] between 1PM - 3PM on: 08/07/23 - Thursday If your arrival time is 6:00 am, do not arrive before that time as the [facility] entrance doors do not open until 6:00 am.  REMEMBER: Instructions that are not followed completely may result in serious medical risk, up to and including death; or upon the discretion of your surgeon and anesthesiologist your surgery may need to be rescheduled.  Do not eat food after midnight the night before surgery.  No gum chewing or hard candies.  You may however, drink CLEAR liquids up to 2 hours before you are scheduled to arrive for your surgery. Do not drink anything within 2 hours of your scheduled arrival time.  Clear liquids include: - water  - apple juice without pulp - gatorade (not RED colors) - black coffee or tea (Do NOT add milk or creamers to the coffee or tea) Do NOT drink anything that is not on this list.   One week prior to surgery: Stop Anti-inflammatories (NSAIDS) such as Advil, Aleve, Ibuprofen, Motrin, Naproxen, Naprosyn and Aspirin based products such as Excedrin, Goody's Powder, BC Powder. You may however, continue to take Tylenol if needed for pain up until the day of surgery.  Stop ANY OVER THE COUNTER supplements until after surgery.   TAKE ONLY THESE MEDICATIONS THE MORNING OF SURGERY WITH A SIP OF WATER:  famotidine (PEPCID) (take one the night before and one on the morning of surgery - helps to prevent nausea after surgery.) busPIRone (BUSPAR) sertraline (ZOLOFT)  donepezil (ARICEPT)     No Alcohol for 24 hours before or after surgery.  No Smoking including e-cigarettes for 24 hours before surgery.  No chewable tobacco products for at least 6 hours before surgery.  No nicotine patches on the day of surgery.  Do not use any "recreational" drugs  for at least a week (preferably 2 weeks) before your surgery.  Please be advised that the combination of cocaine and anesthesia may have negative outcomes, up to and including death. If you test positive for cocaine, your surgery will be cancelled.  On the morning of surgery brush your teeth with toothpaste and water, you may rinse your mouth with mouthwash if you wish. Do not swallow any toothpaste or mouthwash.  Use CHG Soap or wipes as directed on instruction sheet.  Do not wear jewelry, make-up, hairpins, clips or nail polish.  Do not wear lotions, powders, or perfumes.   Do not shave body hair from the neck down 48 hours before surgery.  Contact lenses, hearing aids and dentures may not be worn into surgery.  Do not bring valuables to the hospital. [facility] is not responsible for any missing/lost belongings or valuables.   Notify your doctor if there is any change in your medical condition (cold, fever, infection).  Wear comfortable clothing (specific to your surgery type) to the hospital.  After surgery, you can help prevent lung complications by doing breathing exercises.  Take deep breaths and cough every 1-2 hours. Your doctor may order a device called an Incentive Spirometer to help you take deep breaths. When coughing or sneezing, hold a pillow firmly against your incision with both hands. This is called "splinting." Doing this helps protect your incision. It also decreases belly discomfort.  If you are being admitted to the hospital overnight, leave your  suitcase in the car. After surgery it may be brought to your room.  In case of increased patient census, it may be necessary for you, the patient, to continue your postoperative care in the Same Day Surgery department.  If you are being discharged the day of surgery, you will not be allowed to drive home. You will need a responsible individual to drive you home and stay with you for 24 hours after surgery.   If you  are taking public transportation, you will need to have a responsible individual with you.  Please call the Pre-admissions Testing Dept. at [phone] if you have any questions about these instructions.  Surgery Visitation Policy:  Patients having surgery or a procedure may have two visitors.  Children under the age of 51 must have an adult with them who is not the patient.  Inpatient Visitation:    Visiting hours are 7 a.m. to 8 p.m. Up to four visitors are allowed at one time in a patient room. The visitors may rotate out with other people during the day.  One visitor age 40 or older may stay with the patient overnight and must be in the room by 8 p.m.    Preparing for Surgery with CHLORHEXIDINE GLUCONATE (CHG) Soap  Chlorhexidine Gluconate (CHG) Soap  o An antiseptic cleaner that kills germs and bonds with the skin to continue killing germs even after washing  o Used for showering the night before surgery and morning of surgery  Before surgery, you can play an important role by reducing the number of germs on your skin.  CHG (Chlorhexidine gluconate) soap is an antiseptic cleanser which kills germs and bonds with the skin to continue killing germs even after washing.  Please do not use if you have an allergy to CHG or antibacterial soaps. If your skin becomes reddened/irritated stop using the CHG.  1. Shower the NIGHT BEFORE SURGERY and the MORNING OF SURGERY with CHG soap.  2. If you choose to wash your hair, wash your hair first as usual with your normal shampoo.  3. After shampooing, rinse your hair and body thoroughly to remove the shampoo.  4. Use CHG as you would any other liquid soap. You can apply CHG directly to the skin and wash gently with a scrungie or a clean washcloth.  5. Apply the CHG soap to your body only from the neck down. Do not use on open wounds or open sores. Avoid contact with your eyes, ears, mouth, and genitals (private parts). Wash face and  genitals (private parts) with your normal soap.  6. Wash thoroughly, paying special attention to the area where your surgery will be performed.  7. Thoroughly rinse your body with warm water.  8. Do not shower/wash with your normal soap after using and rinsing off the CHG soap.  9. Pat yourself dry with a clean towel.  10. Wear clean pajamas to bed the night before surgery.  12. Place clean sheets on your bed the night of your first shower and do not sleep with pets.  13. Shower again with the CHG soap on the day of surgery prior to arriving at the hospital.  14. Do not apply any deodorants/lotions/powders.  15. Please wear clean clothes to the hospital.

## 2023-08-01 ENCOUNTER — Other Ambulatory Visit: Payer: Medicare Other

## 2023-08-04 ENCOUNTER — Encounter
Admission: RE | Admit: 2023-08-04 | Discharge: 2023-08-04 | Disposition: A | Discharge status: Home / Self Care | Payer: Medicare Other | Source: Ambulatory Visit | Attending: Surgery | Admitting: Surgery

## 2023-08-04 DIAGNOSIS — E7800 Pure hypercholesterolemia, unspecified: Secondary | ICD-10-CM | POA: Diagnosis not present

## 2023-08-04 DIAGNOSIS — Z01812 Encounter for preprocedural laboratory examination: Secondary | ICD-10-CM

## 2023-08-04 DIAGNOSIS — N1831 Chronic kidney disease, stage 3a: Secondary | ICD-10-CM | POA: Insufficient documentation

## 2023-08-04 DIAGNOSIS — Z01818 Encounter for other preprocedural examination: Secondary | ICD-10-CM | POA: Insufficient documentation

## 2023-08-04 LAB — CBC
HCT: 38.7 % (ref 36.0–46.0)
Hemoglobin: 12.5 g/dL (ref 12.0–15.0)
MCH: 30.3 pg (ref 26.0–34.0)
MCHC: 32.3 g/dL (ref 30.0–36.0)
MCV: 93.7 fL (ref 80.0–100.0)
Platelets: 264 10*3/uL (ref 150–400)
RBC: 4.13 MIL/uL (ref 3.87–5.11)
RDW: 13.5 % (ref 11.5–15.5)
WBC: 7.5 10*3/uL (ref 4.0–10.5)
nRBC: 0 % (ref 0.0–0.2)

## 2023-08-04 LAB — BASIC METABOLIC PANEL
Anion gap: 9 (ref 5–15)
BUN: 18 mg/dL (ref 8–23)
CO2: 24 mmol/L (ref 22–32)
Calcium: 9.1 mg/dL (ref 8.9–10.3)
Chloride: 109 mmol/L (ref 98–111)
Creatinine, Ser: 0.91 mg/dL (ref 0.44–1.00)
GFR, Estimated: >60 mL/min (ref >60)
Glucose, Bld: 100 mg/dL — ABNORMAL HIGH (ref 70–99)
Potassium: 4.4 mmol/L (ref 3.5–5.1)
Sodium: 142 mmol/L (ref 135–145)

## 2023-08-07 MED ORDER — LACTATED RINGERS IV SOLN: INTRAVENOUS | Status: DC

## 2023-08-07 MED ORDER — CHLORHEXIDINE GLUCONATE CLOTH 2 % EX PADS
6.0000 | MEDICATED_PAD | Freq: Once | CUTANEOUS | Status: AC
  Administered 2023-08-08: 6 via TOPICAL

## 2023-08-07 MED ORDER — ORAL CARE MOUTH RINSE: 15.0000 mL | Freq: Once | OROMUCOSAL | Status: AC

## 2023-08-07 MED ORDER — CHLORHEXIDINE GLUCONATE 0.12 % MT SOLN
15.0000 mL | Freq: Once | OROMUCOSAL | Status: AC
  Administered 2023-08-08: 15 mL via OROMUCOSAL

## 2023-08-07 MED ORDER — CEFAZOLIN SODIUM-DEXTROSE 2-4 GM/100ML-% IV SOLN
2.0000 g | INTRAVENOUS | Status: AC
  Administered 2023-08-08: 2 g via INTRAVENOUS

## 2023-08-08 ENCOUNTER — Ambulatory Visit: Payer: Medicare Other | Admitting: Certified Registered"

## 2023-08-08 ENCOUNTER — Other Ambulatory Visit: Payer: Self-pay

## 2023-08-08 ENCOUNTER — Encounter
Admission: RE | Disposition: A | Discharge status: Home / Self Care | Payer: Self-pay | Source: Ambulatory Visit | Attending: Surgery

## 2023-08-08 ENCOUNTER — Encounter: Payer: Self-pay | Admitting: Surgery

## 2023-08-08 ENCOUNTER — Ambulatory Visit: Payer: Medicare Other | Admitting: Urgent Care

## 2023-08-08 ENCOUNTER — Ambulatory Visit
Admission: RE | Admit: 2023-08-08 | Discharge: 2023-08-08 | Disposition: A | Discharge status: Home / Self Care | Payer: Medicare Other | Source: Ambulatory Visit | Attending: Surgery | Admitting: Surgery

## 2023-08-08 DIAGNOSIS — Z87891 Personal history of nicotine dependence: Secondary | ICD-10-CM | POA: Insufficient documentation

## 2023-08-08 DIAGNOSIS — M1990 Unspecified osteoarthritis, unspecified site: Secondary | ICD-10-CM | POA: Insufficient documentation

## 2023-08-08 DIAGNOSIS — L989 Disorder of the skin and subcutaneous tissue, unspecified: Secondary | ICD-10-CM | POA: Diagnosis present

## 2023-08-08 DIAGNOSIS — F0390 Unspecified dementia without behavioral disturbance: Secondary | ICD-10-CM | POA: Diagnosis not present

## 2023-08-08 DIAGNOSIS — C44629 Squamous cell carcinoma of skin of left upper limb, including shoulder: Secondary | ICD-10-CM | POA: Diagnosis not present

## 2023-08-08 DIAGNOSIS — N1831 Chronic kidney disease, stage 3a: Secondary | ICD-10-CM | POA: Insufficient documentation

## 2023-08-08 DIAGNOSIS — R7303 Prediabetes: Secondary | ICD-10-CM | POA: Insufficient documentation

## 2023-08-08 DIAGNOSIS — K219 Gastro-esophageal reflux disease without esophagitis: Secondary | ICD-10-CM | POA: Diagnosis not present

## 2023-08-08 HISTORY — PX: MASS EXCISION: SHX2000

## 2023-08-08 SURGERY — EXCISION MASS
Anesthesia: General | Site: Elbow | Laterality: Left

## 2023-08-08 MED ORDER — PROPOFOL 10 MG/ML IV BOLUS
INTRAVENOUS | Status: DC | PRN
  Administered 2023-08-08: 120 mg via INTRAVENOUS

## 2023-08-08 MED ORDER — BUPIVACAINE-EPINEPHRINE (PF) 0.5% -1:200000 IJ SOLN
INTRAMUSCULAR | Status: AC
  Filled 2023-08-08: qty 30

## 2023-08-08 MED ORDER — OXYCODONE HCL 5 MG PO TABS: 5.0000 mg | ORAL_TABLET | Freq: Once | ORAL | Status: DC | PRN

## 2023-08-08 MED ORDER — ACETAMINOPHEN 10 MG/ML IV SOLN: 1000.0000 mg | Freq: Once | INTRAVENOUS | Status: DC | PRN

## 2023-08-08 MED ORDER — ONDANSETRON HCL 4 MG/2ML IJ SOLN
INTRAMUSCULAR | Status: DC | PRN
  Administered 2023-08-08: 4 mg via INTRAVENOUS

## 2023-08-08 MED ORDER — OXYCODONE HCL 5 MG/5ML PO SOLN: 5.0000 mg | Freq: Once | ORAL | Status: DC | PRN

## 2023-08-08 MED ORDER — OXYCODONE-ACETAMINOPHEN 5-325 MG PO TABS
1.0000 | ORAL_TABLET | ORAL | 0 refills | Status: AC | PRN
Start: 2023-08-08 — End: ?

## 2023-08-08 MED ORDER — LIDOCAINE HCL (PF) 1 % IJ SOLN
INTRAMUSCULAR | Status: DC | PRN
  Administered 2023-08-08: 6 mL

## 2023-08-08 MED ORDER — PHENYLEPHRINE 80 MCG/ML (10ML) SYRINGE FOR IV PUSH (FOR BLOOD PRESSURE SUPPORT)
PREFILLED_SYRINGE | INTRAVENOUS | Status: DC | PRN
  Administered 2023-08-08: 80 ug via INTRAVENOUS
  Administered 2023-08-08: 40 ug via INTRAVENOUS

## 2023-08-08 MED ORDER — CEFAZOLIN SODIUM-DEXTROSE 2-4 GM/100ML-% IV SOLN
INTRAVENOUS | Status: AC
  Filled 2023-08-08: qty 100

## 2023-08-08 MED ORDER — LIDOCAINE HCL (CARDIAC) PF 100 MG/5ML IV SOSY
PREFILLED_SYRINGE | INTRAVENOUS | Status: DC | PRN
  Administered 2023-08-08: 50 mg via INTRAVENOUS

## 2023-08-08 MED ORDER — FENTANYL CITRATE (PF) 100 MCG/2ML IJ SOLN
INTRAMUSCULAR | Status: AC
  Filled 2023-08-08: qty 2

## 2023-08-08 MED ORDER — CHLORHEXIDINE GLUCONATE 0.12 % MT SOLN
OROMUCOSAL | Status: AC
  Filled 2023-08-08: qty 15

## 2023-08-08 MED ORDER — EPHEDRINE SULFATE (PRESSORS) 50 MG/ML IJ SOLN
INTRAMUSCULAR | Status: DC | PRN
  Administered 2023-08-08: 10 mg via INTRAVENOUS

## 2023-08-08 MED ORDER — LACTATED RINGERS IV SOLN: INTRAVENOUS | Status: DC

## 2023-08-08 MED ORDER — FENTANYL CITRATE (PF) 100 MCG/2ML IJ SOLN: 25.0000 ug | INTRAMUSCULAR | Status: DC | PRN

## 2023-08-08 MED ORDER — BUPIVACAINE-EPINEPHRINE 0.5% -1:200000 IJ SOLN
INTRAMUSCULAR | Status: DC | PRN
  Administered 2023-08-08: 6 mL

## 2023-08-08 MED ORDER — FENTANYL CITRATE (PF) 100 MCG/2ML IJ SOLN
INTRAMUSCULAR | Status: DC | PRN
  Administered 2023-08-08: 25 ug via INTRAVENOUS

## 2023-08-08 MED ORDER — DEXAMETHASONE SODIUM PHOSPHATE 10 MG/ML IJ SOLN
INTRAMUSCULAR | Status: DC | PRN
  Administered 2023-08-08: 17 mg via INTRAVENOUS

## 2023-08-08 MED ORDER — LIDOCAINE HCL (PF) 1 % IJ SOLN
INTRAMUSCULAR | Status: AC
  Filled 2023-08-08: qty 30

## 2023-08-08 MED ORDER — ONDANSETRON HCL 4 MG/2ML IJ SOLN: 4.0000 mg | Freq: Once | INTRAMUSCULAR | Status: DC | PRN

## 2023-08-08 SURGICAL SUPPLY — 35 items
ADH SKN CLS APL DERMABOND .7 (GAUZE/BANDAGES/DRESSINGS) ×1
APL PRP STRL LF DISP 70% ISPRP (MISCELLANEOUS) ×1
BLADE SURG 15 STRL LF DISP TIS (BLADE) ×1 IMPLANT
BLADE SURG 15 STRL SS (BLADE) ×1
CHLORAPREP W/TINT 26 (MISCELLANEOUS) ×1 IMPLANT
DERMABOND ADVANCED .7 DNX12 (GAUZE/BANDAGES/DRESSINGS) ×1 IMPLANT
DRAPE LAPAROTOMY 100X77 ABD (DRAPES) ×1 IMPLANT
DRAPE SHEET LG 3/4 BI-LAMINATE (DRAPES) ×1 IMPLANT
ELECT CAUTERY BLADE 6.4 (BLADE) ×1 IMPLANT
ELECT REM PT RETURN 9FT ADLT (ELECTROSURGICAL) ×1
ELECTRODE REM PT RTRN 9FT ADLT (ELECTROSURGICAL) ×1 IMPLANT
GAUZE 4X4 16PLY ~~LOC~~+RFID DBL (SPONGE) ×1 IMPLANT
GLOVE BIOGEL PI IND STRL 7.0 (GLOVE) ×1 IMPLANT
GLOVE SURG SYN 6.5 ES PF (GLOVE) ×2 IMPLANT
GLOVE SURG SYN 6.5 PF PI (GLOVE) ×1 IMPLANT
GOWN STRL REUS W/ TWL LRG LVL3 (GOWN DISPOSABLE) ×2 IMPLANT
GOWN STRL REUS W/TWL LRG LVL3 (GOWN DISPOSABLE) ×2
KIT TURNOVER KIT A (KITS) ×1 IMPLANT
LABEL OR SOLS (LABEL) ×1 IMPLANT
MANIFOLD NEPTUNE II (INSTRUMENTS) ×1 IMPLANT
NDL HYPO 22X1.5 SAFETY MO (MISCELLANEOUS) ×1 IMPLANT
NEEDLE HYPO 22X1.5 SAFETY MO (MISCELLANEOUS) ×1 IMPLANT
NS IRRIG 1000ML POUR BTL (IV SOLUTION) ×1 IMPLANT
PACK BASIN MINOR ARMC (MISCELLANEOUS) ×1 IMPLANT
SUT ETHILON 3-0 FS-10 30 BLK (SUTURE) ×1
SUT MNCRL 4-0 (SUTURE) ×1
SUT MNCRL 4-0 27XMFL (SUTURE) ×1
SUT VIC AB 3-0 SH 27 (SUTURE) ×1
SUT VIC AB 3-0 SH 27X BRD (SUTURE) ×1 IMPLANT
SUTURE EHLN 3-0 FS-10 30 BLK (SUTURE) IMPLANT
SUTURE MNCRL 4-0 27XMF (SUTURE) ×1 IMPLANT
SYR 30ML LL (SYRINGE) ×1 IMPLANT
TOWEL OR 17X26 4PK STRL BLUE (TOWEL DISPOSABLE) ×1 IMPLANT
TRAP FLUID SMOKE EVACUATOR (MISCELLANEOUS) ×1 IMPLANT
WATER STERILE IRR 500ML POUR (IV SOLUTION) ×1 IMPLANT

## 2023-08-08 NOTE — Discharge Instructions (Addendum)
Removal, Care After This sheet gives you information about how to care for yourself after your procedure. Your health care provider may also give you more specific instructions. If you have problems or questions, contact your health care provider. What can I expect after the procedure? After the procedure, it is common to have: Soreness. Bruising. Itching. Follow these instructions at home: site care Follow instructions from your health care provider about how to take care of your site. Make sure you: Wash your hands with soap and water before and after you change your bandage (dressing). If soap and water are not available, use hand sanitizer. Leave stitches (sutures), skin glue, or adhesive strips in place. These skin closures may need to stay in place for 2 weeks or longer. If adhesive strip edges start to loosen and curl up, you may trim the loose edges. Do not remove adhesive strips completely unless your health care provider tells you to do that. If the area bleeds or bruises, apply gentle pressure for 10 minutes. OK TO SHOWER IN 24HRS  Check your site every day for signs of infection. Check for: Redness, swelling, or pain. Fluid or blood. Warmth. Pus or a bad smell.  General instructions Rest and then return to your normal activities as told by your health care provider.  tylenol and alleve as needed for discomfort.  Please alternate between the two as needed for pain.    Use narcotics, if prescribed, only when tylenol and alleve is not enough to control pain.  325-650mg  every 8hrs to max of 3000mg /24hrs (including the 325mg  in every norco dose) for the tylenol.    Keep all follow-up visits as told by your health care provider. This is important. Contact a health care provider if: You have redness, swelling, or pain around your site. You have fluid or blood coming from your site. Your site feels warm to the touch. You have pus or a bad smell coming from your site. You have a  fever. Your sutures, skin glue, or adhesive strips loosen or come off sooner than expected. Get help right away if: You have bleeding that does not stop with pressure or a dressing. Summary After the procedure, it is common to have some soreness, bruising, and itching at the site. Follow instructions from your health care provider about how to take care of your site. Check your site every day for signs of infection. Contact a health care provider if you have redness, swelling, or pain around your site, or your site feels warm to the touch. Keep all follow-up visits as told by your health care provider. This is important. This information is not intended to replace advice given to you by your health care provider. Make sure you discuss any questions you have with your health care provider. Document Released: 12/08/2015 Document Revised: 05/11/2018 Document Reviewed: 05/11/2018 Elsevier Interactive Patient Education  2019 [employer]   AMBULATORY SURGERY  DISCHARGE INSTRUCTIONS   The drugs that you were given will stay in your system until tomorrow so for the next 24 hours you should not:  Drive an automobile Make any legal decisions Drink any alcoholic beverage   You may resume regular meals tomorrow.  Today it is better to start with liquids and gradually work up to solid foods.  You may eat anything you prefer, but it is better to start with liquids, then soup and crackers, and gradually work up to solid foods.   Please notify your doctor immediately if you have any  unusual bleeding, trouble breathing, redness and pain at the surgery site, drainage, fever, or pain not relieved by medication.    Additional Instructions:        Please contact your physician with any problems or Same Day Surgery at [phone], Monday through Friday 6 am to 4 pm, or [facility] number at [phone].

## 2023-08-08 NOTE — Interval H&P Note (Signed)
No change. OK to proceed.

## 2023-08-08 NOTE — Transfer of Care (Signed)
Immediate Anesthesia Transfer of Care Note  Patient: Melissa Anthony  Procedure(s) Performed: EXCISION MASS skin lesion (Left: Elbow)  Patient Location: PACU  Anesthesia Type:General  Level of Consciousness: awake  Airway & Oxygen Therapy: Patient Spontanous Breathing  Post-op Assessment: Report given to RN and Post -op Vital signs reviewed and stable  Post vital signs: Reviewed and stable  Last Vitals:  Vitals Value Taken Time  BP    Temp    Pulse    Resp    SpO2      Last Pain:  Vitals:   08/08/23 0924  TempSrc: Temporal  PainSc: 0-No pain         Complications: No notable events documented.

## 2023-08-08 NOTE — Anesthesia Postprocedure Evaluation (Signed)
Anesthesia Post Note  Patient: Melissa Anthony  Procedure(s) Performed: EXCISION MASS skin lesion (Left: Elbow)  Patient location during evaluation: PACU Anesthesia Type: General Level of consciousness: awake and alert, oriented and patient cooperative Pain management: pain level controlled Vital Signs Assessment: post-procedure vital signs reviewed and stable Respiratory status: spontaneous breathing, nonlabored ventilation and respiratory function stable Cardiovascular status: blood pressure returned to baseline and stable Postop Assessment: adequate PO intake Anesthetic complications: no   No notable events documented.   Last Vitals:  Vitals:   08/08/23 1205 08/08/23 1210  BP:    Pulse: 64 78  Resp: 14 (!) 9  Temp:    SpO2: 100% 95%    Last Pain:  Vitals:   08/08/23 1200  TempSrc:   PainSc: 0-No pain                 Reed Breech

## 2023-08-08 NOTE — Op Note (Signed)
Pre-Op Dx: left forearm lesion Post-Op Dx: same Anesthesia: MAC EBL: 10ml Complications:  none apparent Specimen: left forearm lesion Procedure: excisional biopsy of left forearm excision Surgeon: Tonna Boehringer  Indications for procedure: See H&P  Description of Procedure:  Consent obtained, time out performed.  Patient placed in suping position.  Area sterilized and draped in usual position.  Local infused to area previously marked.  8cm elliptical incision made through dermis with 15blade around lesion, likely BCC.  The 4cm x 4.2cm x 4.5cm lesion then removed from surrounding tissue completely using electrocautery, passed off field pending pathology.  Skin then undermined circumferentially to relieve some tension in preparation for closure. Wound hemostasis noted, then closed in two layer fashion with 3-0 vicryl in interrupted fashion for deep dermal layer, then running 4-0 monocryl in subcuticular fashion for epidermal layer.  Wound then dressed with dermabond.  Pt tolerated procedure well, and transferred to PACU in stable condition. Sponge and instrument count correct at end of procedure.

## 2023-08-08 NOTE — Anesthesia Preprocedure Evaluation (Addendum)
Anesthesia Evaluation  Patient identified by MRN, date of birth, ID band Patient awake    Reviewed: Allergy & Precautions, NPO status , Patient's Chart, lab work & pertinent test results  History of Anesthesia Complications Negative for: history of anesthetic complications  Airway Mallampati: III   Neck ROM: Full    Dental  (+) Upper Dentures, Lower Dentures   Pulmonary former smoker (quit 1980)   Pulmonary exam normal breath sounds clear to auscultation       Cardiovascular Exercise Tolerance: Good Normal cardiovascular exam Rhythm:Regular Rate:Normal  ECG 08/04/23: normal   Neuro/Psych  PSYCHIATRIC DISORDERS Anxiety Depression   Dementia negative neurological ROS     GI/Hepatic ,GERD  ,,  Endo/Other  Prediabetes   Renal/GU Renal disease (stage III CKD)     Musculoskeletal  (+) Arthritis ,    Abdominal   Peds  Hematology negative hematology ROS (+)   Anesthesia Other Findings   Reproductive/Obstetrics                             Anesthesia Physical Anesthesia Plan  ASA: 2  Anesthesia Plan: General   Post-op Pain Management:    Induction: Intravenous  PONV Risk Score and Plan: 3 and Ondansetron, Dexamethasone and Treatment may vary due to age or medical condition  Airway Management Planned: LMA  Additional Equipment:   Intra-op Plan:   Post-operative Plan: Extubation in OR  Informed Consent: I have reviewed the patients History and Physical, chart, labs and discussed the procedure including the risks, benefits and alternatives for the proposed anesthesia with the patient or authorized representative who has indicated his/her understanding and acceptance.     Dental advisory given  Plan Discussed with: CRNA  Anesthesia Plan Comments: (Patient consented for risks of anesthesia including but not limited to:  - adverse reactions to medications - damage to eyes, teeth,  lips or other oral mucosa - nerve damage due to positioning  - sore throat or hoarseness - damage to heart, brain, nerves, lungs, other parts of body or loss of life  Informed patient about role of CRNA in peri- and intra-operative care.  Patient voiced understanding.)        Anesthesia Quick Evaluation

## 2023-08-08 NOTE — Anesthesia Procedure Notes (Signed)
Procedure Name: LMA Insertion Date/Time: 08/08/2023 10:51 AM  Performed by: Cheral Bay, CRNAPre-anesthesia Checklist: Patient identified, Emergency Drugs available, Suction available and Patient being monitored Patient Re-evaluated:Patient Re-evaluated prior to induction Oxygen Delivery Method: Circle system utilized Preoxygenation: Pre-oxygenation with 100% oxygen Induction Type: IV induction Ventilation: Mask ventilation without difficulty LMA: LMA inserted LMA Size: 4.0 Tube type: Oral Tube size: 4.0 mm Number of attempts: 1 Placement Confirmation: positive ETCO2 and breath sounds checked- equal and bilateral Tube secured with: Tape Dental Injury: Teeth and Oropharynx as per pre-operative assessment

## 2023-08-09 ENCOUNTER — Encounter: Payer: Self-pay | Admitting: Surgery

## 2023-08-11 LAB — SURGICAL PATHOLOGY
